# Patient Record
Sex: Female | Born: 1940 | Race: White | Hispanic: No | Marital: Married | Smoking: Never smoker
Health system: Southern US, Community
[De-identification: ages and names within clinical notes are randomized; demographics above are authoritative.]

## PROBLEM LIST (undated history)

## (undated) DIAGNOSIS — F32A Depression, unspecified: Secondary | ICD-10-CM

## (undated) DIAGNOSIS — M4800 Spinal stenosis, site unspecified: Secondary | ICD-10-CM

## (undated) DIAGNOSIS — N390 Urinary tract infection, site not specified: Secondary | ICD-10-CM

## (undated) DIAGNOSIS — J189 Pneumonia, unspecified organism: Secondary | ICD-10-CM

## (undated) DIAGNOSIS — R112 Nausea with vomiting, unspecified: Secondary | ICD-10-CM

## (undated) DIAGNOSIS — R011 Cardiac murmur, unspecified: Secondary | ICD-10-CM

## (undated) DIAGNOSIS — J302 Other seasonal allergic rhinitis: Secondary | ICD-10-CM

## (undated) DIAGNOSIS — K5790 Diverticulosis of intestine, part unspecified, without perforation or abscess without bleeding: Secondary | ICD-10-CM

## (undated) DIAGNOSIS — K219 Gastro-esophageal reflux disease without esophagitis: Secondary | ICD-10-CM

## (undated) DIAGNOSIS — T4145XA Adverse effect of unspecified anesthetic, initial encounter: Secondary | ICD-10-CM

## (undated) DIAGNOSIS — E785 Hyperlipidemia, unspecified: Secondary | ICD-10-CM

## (undated) DIAGNOSIS — T8859XA Other complications of anesthesia, initial encounter: Secondary | ICD-10-CM

## (undated) DIAGNOSIS — C801 Malignant (primary) neoplasm, unspecified: Secondary | ICD-10-CM

## (undated) DIAGNOSIS — J40 Bronchitis, not specified as acute or chronic: Secondary | ICD-10-CM

## (undated) DIAGNOSIS — R059 Cough, unspecified: Secondary | ICD-10-CM

## (undated) DIAGNOSIS — F329 Major depressive disorder, single episode, unspecified: Secondary | ICD-10-CM

## (undated) DIAGNOSIS — G4730 Sleep apnea, unspecified: Secondary | ICD-10-CM

## (undated) DIAGNOSIS — R05 Cough: Secondary | ICD-10-CM

## (undated) DIAGNOSIS — Z9889 Other specified postprocedural states: Secondary | ICD-10-CM

## (undated) DIAGNOSIS — N329 Bladder disorder, unspecified: Secondary | ICD-10-CM

## (undated) DIAGNOSIS — I10 Essential (primary) hypertension: Secondary | ICD-10-CM

## (undated) DIAGNOSIS — M1990 Unspecified osteoarthritis, unspecified site: Secondary | ICD-10-CM

## (undated) HISTORY — PX: SUPERFICIAL LYMPH NODE BIOPSY / EXCISION: SUR127

## (undated) HISTORY — PX: BREAST SURGERY: SHX581

## (undated) HISTORY — PX: LIPOMA EXCISION: SHX5283

## (undated) HISTORY — PX: GANGLION CYST EXCISION: SHX1691

## (undated) HISTORY — PX: EYE SURGERY: SHX253

## (undated) HISTORY — PX: CARDIOVASCULAR STRESS TEST: SHX262

## (undated) HISTORY — PX: CARPAL TUNNEL RELEASE: SHX101

---

## 2011-01-19 ENCOUNTER — Ambulatory Visit (HOSPITAL_COMMUNITY)
Admission: RE | Admit: 2011-01-19 | Discharge: 2011-01-19 | Disposition: A | Discharge status: Home / Self Care | Payer: Medicare Other | Source: Ambulatory Visit | Attending: Neurosurgery | Admitting: Neurosurgery

## 2011-01-19 ENCOUNTER — Other Ambulatory Visit (HOSPITAL_COMMUNITY): Payer: Self-pay | Admitting: Neurosurgery

## 2011-01-19 ENCOUNTER — Encounter (HOSPITAL_COMMUNITY)
Admission: RE | Admit: 2011-01-19 | Discharge: 2011-01-19 | Disposition: A | Discharge status: Home / Self Care | Payer: Medicare Other | Source: Ambulatory Visit | Attending: Neurosurgery | Admitting: Neurosurgery

## 2011-01-19 DIAGNOSIS — Z01818 Encounter for other preprocedural examination: Secondary | ICD-10-CM | POA: Insufficient documentation

## 2011-01-19 DIAGNOSIS — M549 Dorsalgia, unspecified: Secondary | ICD-10-CM

## 2011-01-19 DIAGNOSIS — Z01812 Encounter for preprocedural laboratory examination: Secondary | ICD-10-CM | POA: Insufficient documentation

## 2011-01-20 ENCOUNTER — Encounter (HOSPITAL_COMMUNITY)
Admission: RE | Admit: 2011-01-20 | Discharge: 2011-01-20 | Disposition: A | Discharge status: Home / Self Care | Payer: Medicare Other | Source: Ambulatory Visit | Attending: Neurological Surgery | Admitting: Neurological Surgery

## 2011-01-20 DIAGNOSIS — Z01812 Encounter for preprocedural laboratory examination: Secondary | ICD-10-CM | POA: Insufficient documentation

## 2011-01-21 LAB — CBC
HCT: 40.4 % (ref 36.0–46.0)
Hemoglobin: 12.9 g/dL (ref 12.0–15.0)
MCH: 30.4 pg (ref 26.0–34.0)
MCHC: 31.9 g/dL (ref 30.0–36.0)
RDW: 13.2 % (ref 11.5–15.5)

## 2011-01-21 LAB — BASIC METABOLIC PANEL
CO2: 30 mEq/L (ref 19–32)
Calcium: 9.4 mg/dL (ref 8.4–10.5)
Creatinine, Ser: 0.67 mg/dL (ref 0.4–1.2)
GFR calc non Af Amer: >60 mL/min (ref >60)
Glucose, Bld: 50 mg/dL — ABNORMAL LOW (ref 70–99)
Sodium: 144 mEq/L (ref 135–145)

## 2011-02-25 ENCOUNTER — Encounter (HOSPITAL_COMMUNITY)
Admission: RE | Admit: 2011-02-25 | Discharge: 2011-02-25 | Disposition: A | Discharge status: Home / Self Care | Payer: Medicare Other | Source: Ambulatory Visit | Attending: Neurosurgery | Admitting: Neurosurgery

## 2011-02-25 LAB — DIFFERENTIAL
Basophils Relative: 0 % (ref 0–1)
Eosinophils Absolute: 0.1 10*3/uL (ref 0.0–0.7)
Eosinophils Relative: 3 % (ref 0–5)
Lymphs Abs: 1.6 10*3/uL (ref 0.7–4.0)
Monocytes Absolute: 0.5 10*3/uL (ref 0.1–1.0)
Monocytes Relative: 10 % (ref 3–12)
Neutrophils Relative %: 53 % (ref 43–77)

## 2011-02-25 LAB — BASIC METABOLIC PANEL
BUN: 11 mg/dL (ref 6–23)
Calcium: 9.4 mg/dL (ref 8.4–10.5)
Chloride: 113 mEq/L — ABNORMAL HIGH (ref 96–112)
Creatinine, Ser: 0.76 mg/dL (ref 0.4–1.2)

## 2011-02-25 LAB — CBC
HCT: 37.9 % (ref 36.0–46.0)
MCHC: 33.5 g/dL (ref 30.0–36.0)
Platelets: 166 10*3/uL (ref 150–400)
RDW: 13.2 % (ref 11.5–15.5)

## 2011-02-25 LAB — SURGICAL PCR SCREEN
MRSA, PCR: NEGATIVE
Staphylococcus aureus: NEGATIVE

## 2011-03-04 ENCOUNTER — Observation Stay (HOSPITAL_COMMUNITY): Payer: Medicare Other | Attending: Neurosurgery

## 2011-03-04 ENCOUNTER — Observation Stay (HOSPITAL_COMMUNITY): Payer: Medicare Other

## 2011-03-04 ENCOUNTER — Inpatient Hospital Stay (HOSPITAL_COMMUNITY)
Admission: RE | Admit: 2011-03-04 | Discharge: 2011-03-05 | DRG: 473 | Disposition: A | Discharge status: Home / Self Care | Payer: Medicare Other | Source: Ambulatory Visit | Attending: Neurosurgery | Admitting: Neurosurgery

## 2011-03-04 DIAGNOSIS — M4802 Spinal stenosis, cervical region: Secondary | ICD-10-CM | POA: Diagnosis present

## 2011-03-04 DIAGNOSIS — M47812 Spondylosis without myelopathy or radiculopathy, cervical region: Principal | ICD-10-CM | POA: Diagnosis present

## 2011-03-04 DIAGNOSIS — Z01818 Encounter for other preprocedural examination: Secondary | ICD-10-CM | POA: Insufficient documentation

## 2011-03-18 NOTE — Op Note (Signed)
Terri [name], Terri [name] NO.:  [id number]  MEDICAL RECORD NO.:  [id number]           PATIENT TYPE:  O  LOCATION:  3528                         FACILITY:  [facility]  PHYSICIAN:  Donalee Citrin, M.D.        DATE OF BIRTH:  1941/11/26  DATE OF PROCEDURE:  03/04/2011 DATE OF DISCHARGE:                              OPERATIVE REPORT   PREOPERATIVE DIAGNOSIS:  Cervical spondylosis with stenosis and radiculopathy at C5-6 and C6-7.  PROCEDURE:  Anterior cervical diskectomies and fusion at C5-6 and C6-7 using Globus PEEK cages packed with locally harvested autograft mixed with Actifuse 7 mm at C5-6 and 8 mm at C6-7 and Atlantis translational plate with five 30 mm variable angle screws and one 11-mm rescue screw.  SURGEON:  Donalee Citrin, M.D.  Threasa HeadsYetta Barre.  ANESTHESIA:  General endotracheal.  HISTORY OF PRESENT ILLNESS:  The patient is a very pleasant 70 year old female who has had progressive worsening neck and predominantly left shoulder and arm pain that has been refractory to all forms of conservative treatment.  MRI scan showed cervical spondylosis at multilevel, but the worst of which was C5-6 and C6-7 causing severe spinal cord compression and biforaminal stenosis of both the C6 and C7 nerve roots bilaterally.  Due to the patient's failure to conservative treatment, MRI findings, and clinical exam, the patient was recommended anterior cervical diskectomies and fusion.  I went over the risks and benefits of the operation with the patient.  She understood and agreed to proceed forward.  The patient was brought into the OR, induced under general anesthesia, positioned supine and the neck flexed in slight extension with 5 pounds of halter traction.  The right side of the neck was prepped and draped in usual sterile fashion.  Preop x-ray localized the appropriate level, so a curvilinear incision was made just off midline to the anterior portion of the  sternocleidomastoid.  Superficial layer of platysma was dissected out and divided longitudinally.  The avascular plane between the sternocleidomastoid and strap muscle was developed down to the prevertebral fascia.  Prevertebral fascia was then dissected with Kittner.  Intraoperative x-ray identified the appropriate level of C5-6, so annulotomy was made and disk space was incised.  Large anterior osteophytes were bitten off with 2- and 3-mm Kerrison punch as well as a Gaffer.  Then, both endplates were scraped with a BA curette and then using a high-speed drill capturing the bone shavings in a mucus trap, both disk spaces were drilled down the posterior annulus- osteophyte complex.  At this point, the operating microscope was draped, brought into the field, and under microscopic illumination, first working at C6-7 the disk space was drilled down to the posterior annulus- osteophyte complex and 1-mm Kerrison punch was used to underbite both endplates.  There was marked osteophytosis causing severe thecal sac compression at this level.  The ligament was identified.  Ligament was partially calcified, but it was teased off the dura with a nerve hook and removed piecemeal fashion with 1 and 2-mm Kerrison punch. Aggressively under biting both endplates marching across laterally, both C7 pedicles were identified and both C7  nerve roots were skeletonized, flushed with pedicle.  At the end of the diskectomy, there was no further stenosis either centrally or foraminally, and this was packed with Gelfoam.  Attention was taken to the C5-6.  In the similar fashion, C5-C6 was drilled down.  Again, the predominance of pathology at C5-6 was really at the shoulder of the C6 nerve root.  There was a lot of soft tissue and ligamentous hypertrophy and degenerated facet complex of the uncinate.  This was all aggressively under bitten.  The C6 pedicle was identified.  The C6 nerve root was  decompressed, flushed with pedicle with extensive amount of removal of soft tissue above the shoulder of the C6 nerve root on the left.  The right C6 nerve root was also decompressed and aggressively under bitten,  both endplates decompressed the central canal.  Then, a 7-mm PEEK cage was packed at C5- 6 and an 8 mm at C6-7.  The wound was then copiously irrigated. Meticulous hemostasis was maintained.  An Atlantis translational plate was placed.  All screws had excellent purchase.  Locking mechanisms were engaged.  However, there was one screw at C5 that stripped.  This was removed and replaced with the 11 rescue and this had excellent purchase. Locking mechanism was subsequently engaged.  Then, after meticulous hemostasis was maintained, the wound was copiously irrigated.  Platysma was reapproximated with interrupted Vicryl and the skin was closed with running 4-0 subcuticular.  Benzoin and Steri-Strips were applied.  The patient went to recovery room in stable condition.  At the end of the case needle, sponge, and instrument counts were correct.          ______________________________ Donalee Citrin, M.D.     GC/MEDQ  D:  03/04/2011  T:  03/05/2011  Job:  [id number]  Electronically Signed by Donalee Citrin M.D. on 03/18/2011 05:22:48 PM

## 2011-04-02 NOTE — Discharge Summary (Signed)
  [name], [name] NO.:  [id number]  MEDICAL RECORD NO.:  [id number]           PATIENT TYPE:  I  LOCATION:  3528                         FACILITY:  [facility]  PHYSICIAN:  Donalee Citrin, M.D.        DATE OF BIRTH:  Aug 25, 1941  DATE OF ADMISSION:  03/04/2011 DATE OF DISCHARGE:  03/05/2011                              DISCHARGE SUMMARY   ADMITTING DIAGNOSES:  Cervical spondylosis with radiculopathy.  DISCHARGE DIAGNOSES:  Cervical spondylosis with radiculopathy.  PROCEDURE:  Intracervical diskectomy and fusion at C5-6 and C6-7.  HOSPITAL COURSE:  The patient was admitted in the evening, went to operating room, underwent the aforementioned procedure.  Postop, the patient did very well, went to recovery room floor.  On the floor, the patient convalesced well, was ambulating and voiding spontaneously, tolerating regular diet.  He will be discharged home on day 1, p.o. pain medication, , muscle relaxants and scheduled to follow up in 2 weeks.          ______________________________ Donalee Citrin, M.D.     GC/MEDQ  D:  03/18/2011  T:  03/19/2011  Job:  [id number]  Electronically Signed by Donalee Citrin M.D. on 04/02/2011 11:19:14 AM

## 2011-04-09 ENCOUNTER — Ambulatory Visit
Admission: RE | Admit: 2011-04-09 | Discharge: 2011-04-09 | Disposition: A | Discharge status: Home / Self Care | Payer: Medicare Other | Source: Ambulatory Visit | Attending: Neurosurgery | Admitting: Neurosurgery

## 2011-04-09 ENCOUNTER — Other Ambulatory Visit: Payer: Self-pay | Admitting: Neurosurgery

## 2011-04-09 DIAGNOSIS — M542 Cervicalgia: Secondary | ICD-10-CM

## 2011-05-12 ENCOUNTER — Ambulatory Visit
Admission: RE | Admit: 2011-05-12 | Discharge: 2011-05-12 | Disposition: A | Discharge status: Home / Self Care | Payer: Medicare Other | Source: Ambulatory Visit | Attending: Neurosurgery | Admitting: Neurosurgery

## 2011-05-12 ENCOUNTER — Other Ambulatory Visit: Payer: Self-pay | Admitting: Neurosurgery

## 2011-05-12 DIAGNOSIS — M542 Cervicalgia: Secondary | ICD-10-CM

## 2011-08-11 ENCOUNTER — Other Ambulatory Visit: Payer: Self-pay | Admitting: Neurosurgery

## 2011-08-11 ENCOUNTER — Ambulatory Visit
Admission: RE | Admit: 2011-08-11 | Discharge: 2011-08-11 | Disposition: A | Discharge status: Home / Self Care | Payer: Medicare Other | Source: Ambulatory Visit | Attending: Neurosurgery | Admitting: Neurosurgery

## 2011-08-11 DIAGNOSIS — M542 Cervicalgia: Secondary | ICD-10-CM

## 2012-04-07 IMAGING — CR DG CHEST 2V
2 series · 2 of 2 positions shown · non-contrast
Comparison: None.

CLINICAL DATA: Preoperative film in patient for lumbar
microdiskectomy.

CHEST - 2 VIEW

[view not recorded (1 of 2)]
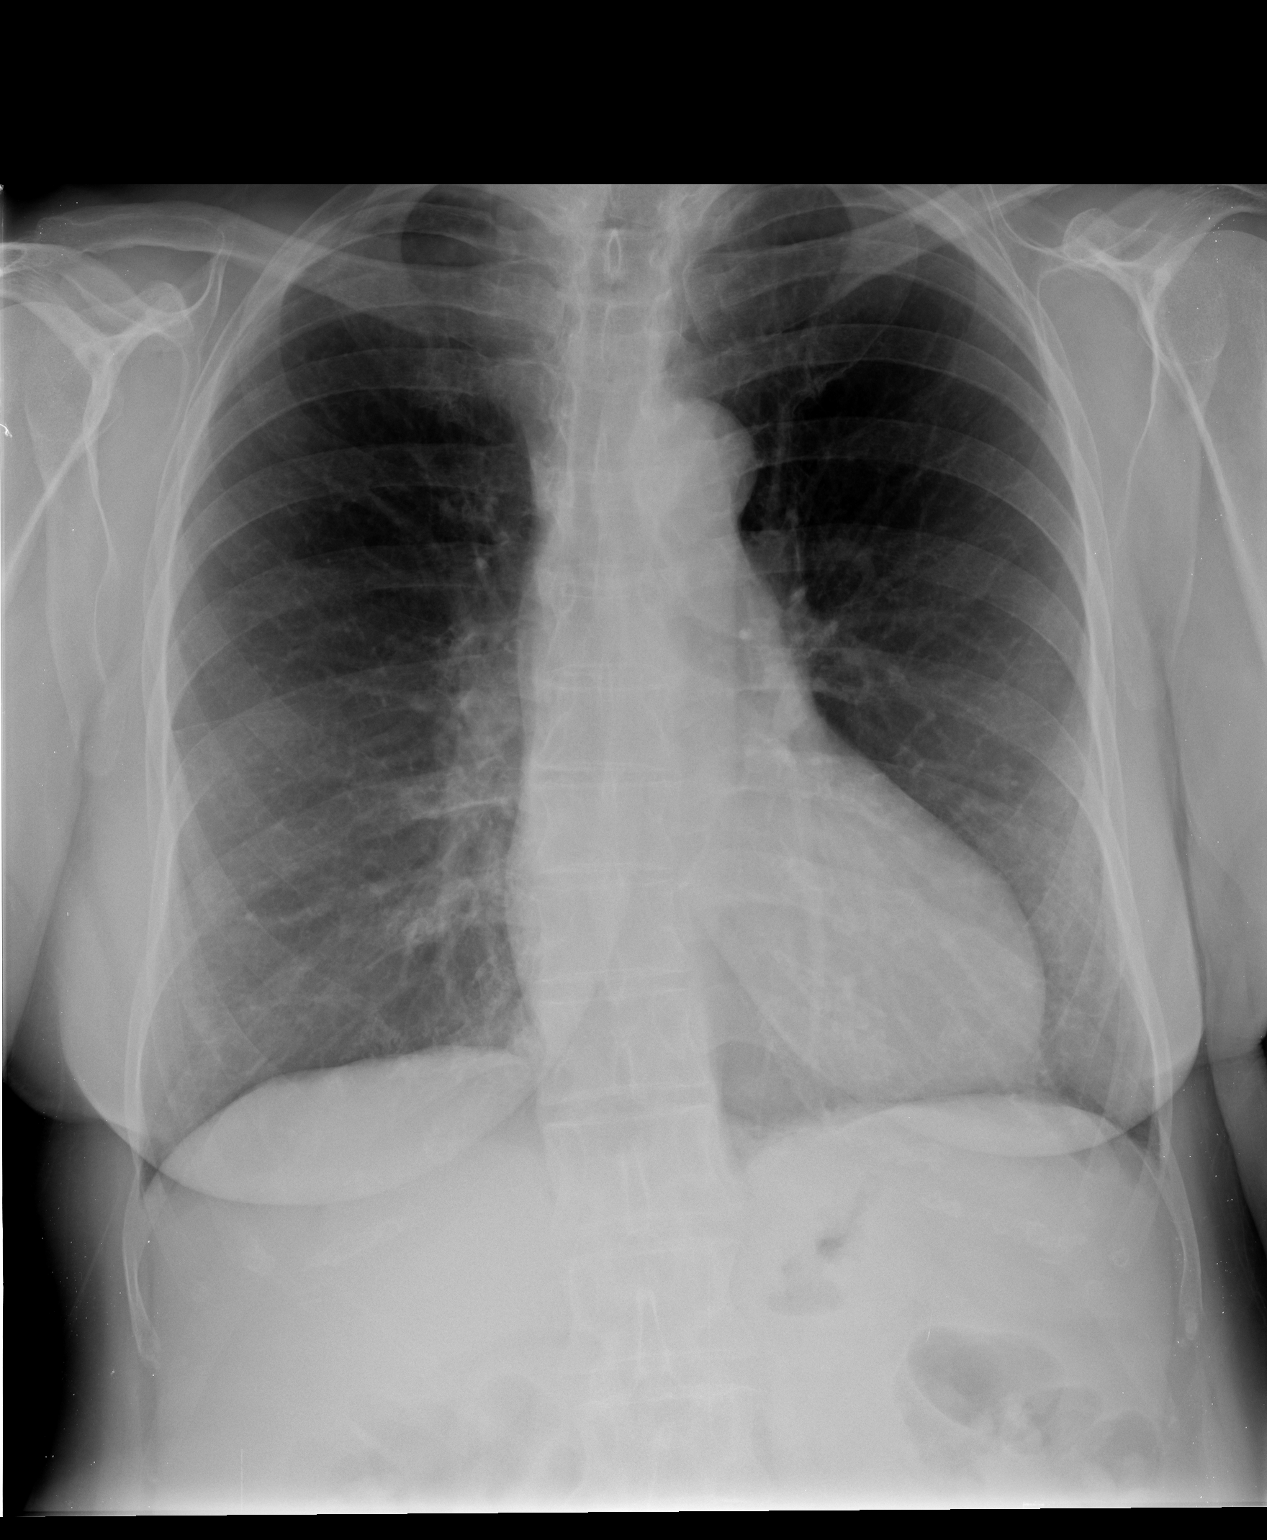

[view not recorded (2 of 2)]
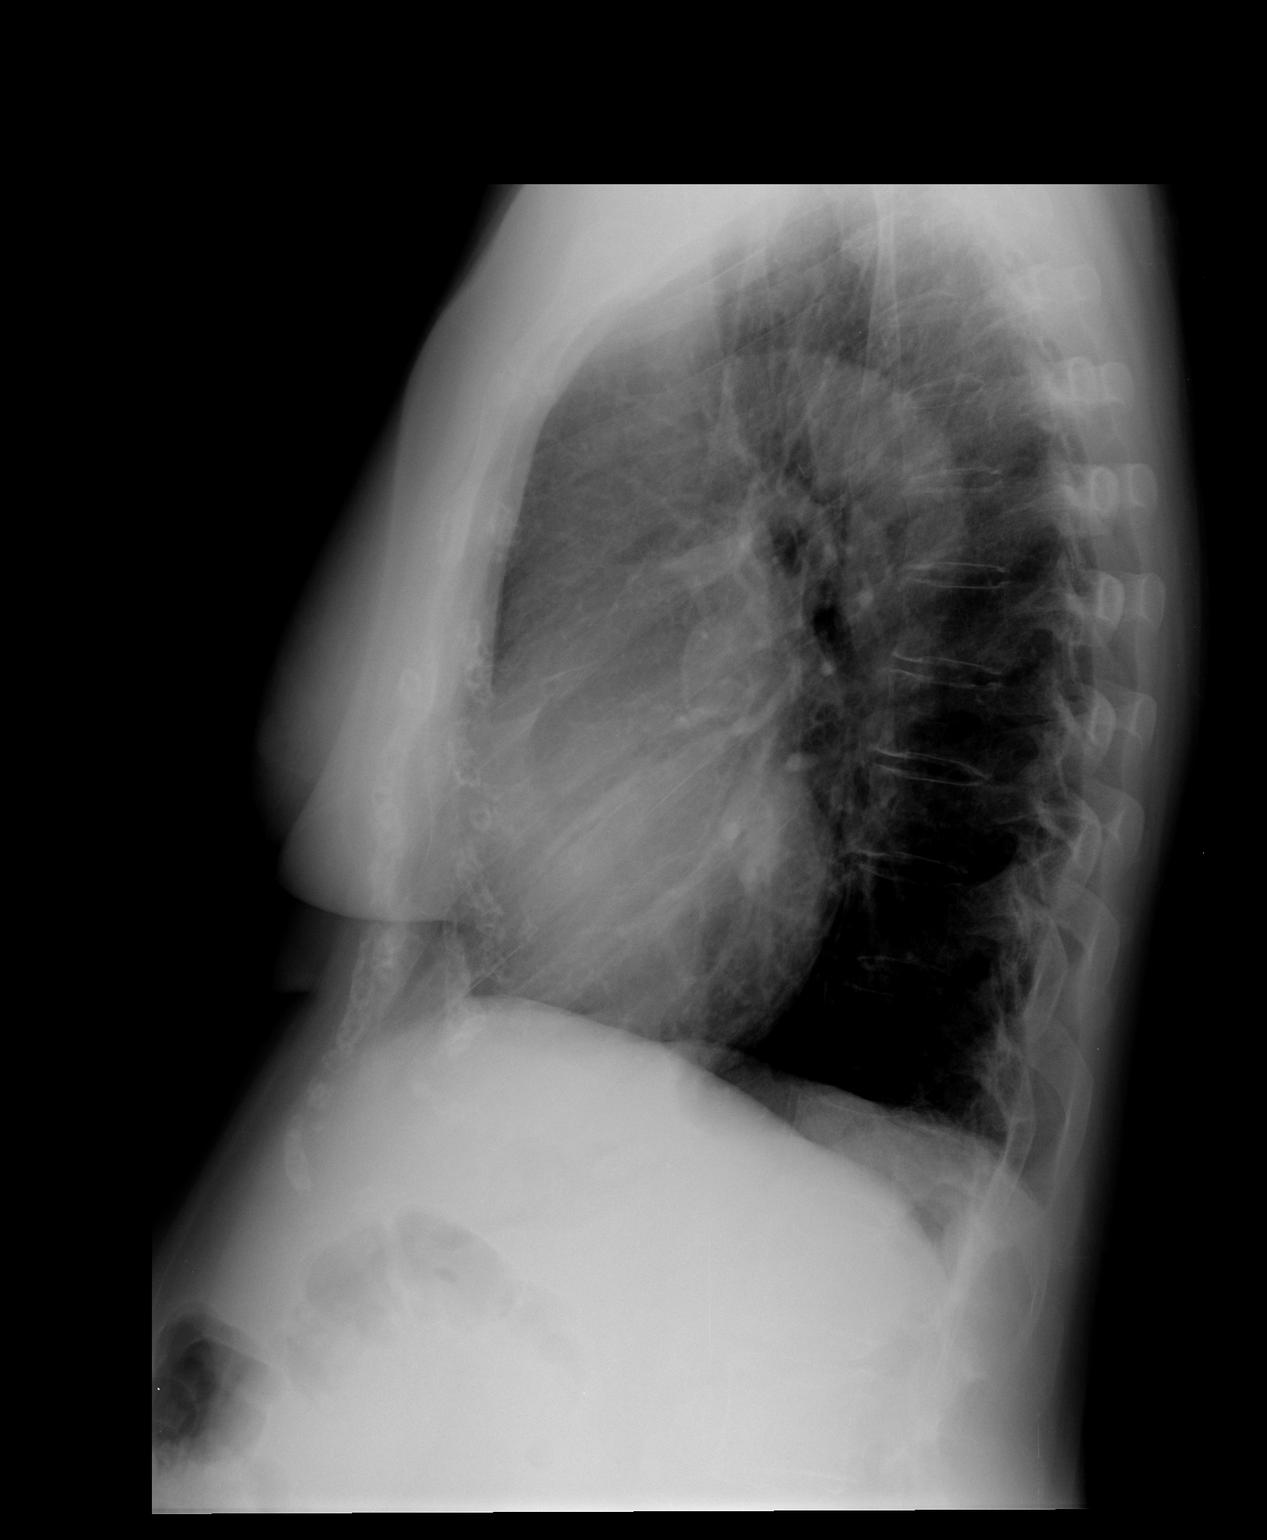

[2 of 2 positions shown; findings below may reference images not displayed]

FINDINGS: Lungs are clear.  No pneumothorax or pleural effusion.
Heart size normal.
IMPRESSION: No acute disease.

## 2012-12-29 ENCOUNTER — Other Ambulatory Visit: Payer: Self-pay | Admitting: Neurosurgery

## 2013-01-03 ENCOUNTER — Encounter (HOSPITAL_COMMUNITY): Payer: Self-pay | Admitting: Pharmacy Technician

## 2013-01-09 ENCOUNTER — Encounter (HOSPITAL_COMMUNITY): Payer: Self-pay

## 2013-01-09 ENCOUNTER — Encounter (HOSPITAL_COMMUNITY)
Admission: RE | Admit: 2013-01-09 | Discharge: 2013-01-09 | Disposition: A | Discharge status: Home / Self Care | Payer: Medicare Other | Source: Ambulatory Visit | Attending: Anesthesiology | Admitting: Anesthesiology

## 2013-01-09 ENCOUNTER — Encounter (HOSPITAL_COMMUNITY)
Admission: RE | Admit: 2013-01-09 | Discharge: 2013-01-09 | Disposition: A | Discharge status: Home / Self Care | Payer: Medicare Other | Source: Ambulatory Visit | Attending: Neurosurgery | Admitting: Neurosurgery

## 2013-01-09 HISTORY — DX: Cough: R05

## 2013-01-09 HISTORY — DX: Diverticulosis of intestine, part unspecified, without perforation or abscess without bleeding: K57.90

## 2013-01-09 HISTORY — DX: Major depressive disorder, single episode, unspecified: F32.9

## 2013-01-09 HISTORY — DX: Nausea with vomiting, unspecified: R11.2

## 2013-01-09 HISTORY — DX: Bladder disorder, unspecified: N32.9

## 2013-01-09 HISTORY — DX: Malignant (primary) neoplasm, unspecified: C80.1

## 2013-01-09 HISTORY — DX: Depression, unspecified: F32.A

## 2013-01-09 HISTORY — DX: Cardiac murmur, unspecified: R01.1

## 2013-01-09 HISTORY — DX: Sleep apnea, unspecified: G47.30

## 2013-01-09 HISTORY — DX: Unspecified osteoarthritis, unspecified site: M19.90

## 2013-01-09 HISTORY — DX: Other seasonal allergic rhinitis: J30.2

## 2013-01-09 HISTORY — DX: Gastro-esophageal reflux disease without esophagitis: K21.9

## 2013-01-09 HISTORY — DX: Other specified postprocedural states: Z98.890

## 2013-01-09 HISTORY — DX: Hyperlipidemia, unspecified: E78.5

## 2013-01-09 HISTORY — DX: Adverse effect of unspecified anesthetic, initial encounter: T41.45XA

## 2013-01-09 HISTORY — DX: Other complications of anesthesia, initial encounter: T88.59XA

## 2013-01-09 HISTORY — DX: Essential (primary) hypertension: I10

## 2013-01-09 HISTORY — DX: Bronchitis, not specified as acute or chronic: J40

## 2013-01-09 HISTORY — DX: Cough, unspecified: R05.9

## 2013-01-09 HISTORY — DX: Pneumonia, unspecified organism: J18.9

## 2013-01-09 HISTORY — DX: Spinal stenosis, site unspecified: M48.00

## 2013-01-09 LAB — CBC
HCT: 37 % (ref 36.0–46.0)
Platelets: 153 10*3/uL (ref 150–400)
RDW: 12.4 % (ref 11.5–15.5)
WBC: 4.6 10*3/uL (ref 4.0–10.5)

## 2013-01-09 LAB — BASIC METABOLIC PANEL
Calcium: 9.1 mg/dL (ref 8.4–10.5)
GFR calc non Af Amer: 85 mL/min — ABNORMAL LOW (ref >90)
Glucose, Bld: 93 mg/dL (ref 70–99)
Sodium: 139 mEq/L (ref 135–145)

## 2013-01-09 LAB — SURGICAL PCR SCREEN: MRSA, PCR: NEGATIVE

## 2013-01-09 NOTE — Pre-Procedure Instructions (Signed)
JAREE DWIGHT  01/09/2013   Your procedure is scheduled on:  Monday January 16, 2013  Report to [facility] at *5:30AM.  Call this number if you have problems the morning of surgery: [phone]   Remember:   Do not eat food or drink liquids after midnight.   Take these medicines the morning of surgery with A SIP OF WATER: celexa, nasonex,     Do not wear jewelry, make-up or nail polish.  Do not wear lotions, powders, or perfumes  Do not shave 48 hours prior to surgery.   Do not bring valuables to the hospital.  Contacts, dentures or bridgework may not be worn into surgery.  Leave suitcase in the car. After surgery it may be brought to your room.    Patients discharged the day of surgery will not be allowed to drive home.  Name and phone number of your driver: family / friend  Special Instructions: Shower using CHG 2 nights before surgery and the night before surgery.  If you shower the day of surgery use CHG.  Use special wash - you have one bottle of CHG for all showers.  You should use approximately 1/3 of the bottle for each shower.   Please read over the following fact sheets that you were given: Pain Booklet, Coughing and Deep Breathing, Blood Transfusion Information, MRSA Information and Surgical Site Infection Prevention

## 2013-01-10 NOTE — Consult Note (Signed)
Anesthesia chart review: Patient is a 72 year old female posted for three-level PLIF by Dr. Wynetta Emery on 01/17/2012. History includes non-smoker, HTN, HLD, depression, GERD, OSA, bladder stem dilation, skin cancer. She also reports a history of a "mild" murmur (not otherwise specified); however, there is no mention of murmur in her latest cardiology notes.  There is mention of bradycardia and palpitations though.  For anesthesia history she reported post-operative N/V and coughing/dysphagia/chest pain after a previous surgery that resolved with medication from her PCP.  She was seen by Cardiologist Dr. Tereso Newcomer at [facility] on 12/22/12 for a pre-operative evaluation.  EKG then showed SB @ 55 bpm, nonspecific ST/T wave abnormality.  He felt she was low risk for surgery and no further cardiac evaluation was needed prior to surgery.  His note mentions a history of a normal stress test in 2011.     Preoperative CXR and labs noted.  Anticipate she can proceed as planned.  Shonna Chock, PA-C 01/10/13 1429

## 2013-01-13 DIAGNOSIS — N390 Urinary tract infection, site not specified: Secondary | ICD-10-CM

## 2013-01-13 HISTORY — DX: Urinary tract infection, site not specified: N39.0

## 2013-01-16 ENCOUNTER — Encounter (HOSPITAL_COMMUNITY)
Admission: RE | Disposition: A | Discharge status: Skilled Nursing Facility | Payer: Self-pay | Source: Ambulatory Visit | Attending: Neurosurgery

## 2013-01-16 ENCOUNTER — Inpatient Hospital Stay (HOSPITAL_COMMUNITY): Payer: Medicare Other | Admitting: Vascular Surgery

## 2013-01-16 ENCOUNTER — Inpatient Hospital Stay (HOSPITAL_COMMUNITY)
Admission: RE | Admit: 2013-01-16 | Discharge: 2013-01-24 | DRG: 460 | Disposition: A | Discharge status: Skilled Nursing Facility | Payer: Medicare Other | Source: Ambulatory Visit | Attending: Neurosurgery | Admitting: Neurosurgery

## 2013-01-16 ENCOUNTER — Inpatient Hospital Stay (HOSPITAL_COMMUNITY): Payer: Medicare Other

## 2013-01-16 ENCOUNTER — Encounter (HOSPITAL_COMMUNITY): Payer: Self-pay | Admitting: Vascular Surgery

## 2013-01-16 ENCOUNTER — Encounter (HOSPITAL_COMMUNITY): Payer: Self-pay | Admitting: *Deleted

## 2013-01-16 DIAGNOSIS — G4730 Sleep apnea, unspecified: Secondary | ICD-10-CM | POA: Diagnosis present

## 2013-01-16 DIAGNOSIS — X58XXXA Exposure to other specified factors, initial encounter: Secondary | ICD-10-CM | POA: Diagnosis present

## 2013-01-16 DIAGNOSIS — M4120 Other idiopathic scoliosis, site unspecified: Secondary | ICD-10-CM | POA: Diagnosis present

## 2013-01-16 DIAGNOSIS — Z860100 Personal history of colon polyps, unspecified: Secondary | ICD-10-CM

## 2013-01-16 DIAGNOSIS — M5137 Other intervertebral disc degeneration, lumbosacral region: Principal | ICD-10-CM | POA: Diagnosis present

## 2013-01-16 DIAGNOSIS — I10 Essential (primary) hypertension: Secondary | ICD-10-CM | POA: Diagnosis present

## 2013-01-16 DIAGNOSIS — IMO0002 Reserved for concepts with insufficient information to code with codable children: Secondary | ICD-10-CM | POA: Diagnosis present

## 2013-01-16 DIAGNOSIS — Z79899 Other long term (current) drug therapy: Secondary | ICD-10-CM

## 2013-01-16 DIAGNOSIS — S058X9A Other injuries of unspecified eye and orbit, initial encounter: Secondary | ICD-10-CM | POA: Diagnosis not present

## 2013-01-16 DIAGNOSIS — Z881 Allergy status to other antibiotic agents status: Secondary | ICD-10-CM

## 2013-01-16 DIAGNOSIS — R112 Nausea with vomiting, unspecified: Secondary | ICD-10-CM | POA: Diagnosis not present

## 2013-01-16 DIAGNOSIS — Z888 Allergy status to other drugs, medicaments and biological substances status: Secondary | ICD-10-CM

## 2013-01-16 DIAGNOSIS — E785 Hyperlipidemia, unspecified: Secondary | ICD-10-CM | POA: Diagnosis present

## 2013-01-16 DIAGNOSIS — G9741 Accidental puncture or laceration of dura during a procedure: Secondary | ICD-10-CM | POA: Diagnosis not present

## 2013-01-16 DIAGNOSIS — M51379 Other intervertebral disc degeneration, lumbosacral region without mention of lumbar back pain or lower extremity pain: Principal | ICD-10-CM | POA: Diagnosis present

## 2013-01-16 DIAGNOSIS — K5730 Diverticulosis of large intestine without perforation or abscess without bleeding: Secondary | ICD-10-CM | POA: Diagnosis present

## 2013-01-16 DIAGNOSIS — F329 Major depressive disorder, single episode, unspecified: Secondary | ICD-10-CM | POA: Diagnosis present

## 2013-01-16 DIAGNOSIS — Q762 Congenital spondylolisthesis: Secondary | ICD-10-CM

## 2013-01-16 DIAGNOSIS — F3289 Other specified depressive episodes: Secondary | ICD-10-CM | POA: Diagnosis present

## 2013-01-16 DIAGNOSIS — K219 Gastro-esophageal reflux disease without esophagitis: Secondary | ICD-10-CM | POA: Diagnosis present

## 2013-01-16 DIAGNOSIS — Z01812 Encounter for preprocedural laboratory examination: Secondary | ICD-10-CM

## 2013-01-16 DIAGNOSIS — N390 Urinary tract infection, site not specified: Secondary | ICD-10-CM | POA: Diagnosis not present

## 2013-01-16 DIAGNOSIS — Y9210 Unspecified residential institution as the place of occurrence of the external cause: Secondary | ICD-10-CM | POA: Diagnosis present

## 2013-01-16 HISTORY — DX: Urinary tract infection, site not specified: N39.0

## 2013-01-16 LAB — POCT I-STAT 4, (NA,K, GLUC, HGB,HCT)
Glucose, Bld: 119 mg/dL — ABNORMAL HIGH (ref 70–99)
Glucose, Bld: 125 mg/dL — ABNORMAL HIGH (ref 70–99)
HCT: 28 % — ABNORMAL LOW (ref 36.0–46.0)
Hemoglobin: 8.5 g/dL — ABNORMAL LOW (ref 12.0–15.0)
Sodium: 140 mEq/L (ref 135–145)

## 2013-01-16 LAB — CBC
HCT: 38.7 % (ref 36.0–46.0)
MCV: 92.8 fL (ref 78.0–100.0)
Platelets: 86 10*3/uL — ABNORMAL LOW (ref 150–400)
RBC: 4.17 MIL/uL (ref 3.87–5.11)
RDW: 13.2 % (ref 11.5–15.5)
WBC: 9.6 10*3/uL (ref 4.0–10.5)

## 2013-01-16 LAB — BASIC METABOLIC PANEL
BUN: 13 mg/dL (ref 6–23)
CO2: 25 mEq/L (ref 19–32)
Calcium: 7.6 mg/dL — ABNORMAL LOW (ref 8.4–10.5)
Creatinine, Ser: 0.64 mg/dL (ref 0.50–1.10)
Glucose, Bld: 136 mg/dL — ABNORMAL HIGH (ref 70–99)

## 2013-01-16 LAB — PREPARE RBC (CROSSMATCH)

## 2013-01-16 SURGERY — POSTERIOR LUMBAR FUSION 2 LEVEL
Anesthesia: General | Site: Spine Lumbar | Laterality: Bilateral | Wound class: Clean

## 2013-01-16 MED ORDER — AMOXICILLIN 500 MG PO CAPS
1000.0000 mg | ORAL_CAPSULE | Freq: Two times a day (BID) | ORAL | Status: AC
  Administered 2013-01-16 – 2013-01-19 (×7): 1000 mg via ORAL
  Filled 2013-01-16 (×7): qty 2

## 2013-01-16 MED ORDER — SODIUM CHLORIDE 0.9 % IV SOLN
INTRAVENOUS | Status: DC | PRN
  Administered 2013-01-16 (×2): via INTRAVENOUS

## 2013-01-16 MED ORDER — SIMVASTATIN 40 MG PO TABS
40.0000 mg | ORAL_TABLET | Freq: Every evening | ORAL | Status: DC
  Administered 2013-01-16 – 2013-01-23 (×8): 40 mg via ORAL
  Filled 2013-01-16 (×9): qty 1

## 2013-01-16 MED ORDER — OXYCODONE-ACETAMINOPHEN 5-325 MG PO TABS
1.0000 | ORAL_TABLET | ORAL | Status: DC | PRN
  Administered 2013-01-19: 1 via ORAL
  Administered 2013-01-19: 2 via ORAL
  Administered 2013-01-19: 1 via ORAL
  Administered 2013-01-20: 2 via ORAL
  Administered 2013-01-20 (×3): 1 via ORAL
  Administered 2013-01-21: 2 via ORAL
  Filled 2013-01-16: qty 2
  Filled 2013-01-16 (×2): qty 1
  Filled 2013-01-16: qty 2
  Filled 2013-01-16 (×3): qty 1
  Filled 2013-01-16: qty 2

## 2013-01-16 MED ORDER — PREDNISOLONE ACETATE 1 % OP SUSP
1.0000 [drp] | Freq: Four times a day (QID) | OPHTHALMIC | Status: DC
  Administered 2013-01-16 – 2013-01-24 (×32): 1 [drp] via OPHTHALMIC
  Filled 2013-01-16 (×2): qty 1

## 2013-01-16 MED ORDER — ARTIFICIAL TEARS OP OINT
TOPICAL_OINTMENT | OPHTHALMIC | Status: DC | PRN
  Administered 2013-01-16: 1 via OPHTHALMIC

## 2013-01-16 MED ORDER — NITROFURANTOIN MONOHYD MACRO 100 MG PO CAPS
100.0000 mg | ORAL_CAPSULE | Freq: Two times a day (BID) | ORAL | Status: AC
  Administered 2013-01-16 – 2013-01-19 (×7): 100 mg via ORAL
  Filled 2013-01-16 (×7): qty 1

## 2013-01-16 MED ORDER — SODIUM CHLORIDE 0.9 % IV SOLN: 250.0000 mL | INTRAVENOUS | Status: DC

## 2013-01-16 MED ORDER — ACETAMINOPHEN 325 MG PO TABS
650.0000 mg | ORAL_TABLET | ORAL | Status: DC | PRN
  Administered 2013-01-18 – 2013-01-23 (×2): 650 mg via ORAL
  Filled 2013-01-16 (×2): qty 2

## 2013-01-16 MED ORDER — ONDANSETRON HCL 4 MG/2ML IJ SOLN
4.0000 mg | INTRAMUSCULAR | Status: DC | PRN
  Administered 2013-01-17 – 2013-01-19 (×4): 4 mg via INTRAVENOUS
  Filled 2013-01-16 (×4): qty 2

## 2013-01-16 MED ORDER — THROMBIN 20000 UNITS EX SOLR
CUTANEOUS | Status: DC | PRN
  Administered 2013-01-16 (×2): via TOPICAL

## 2013-01-16 MED ORDER — BUPIVACAINE HCL (PF) 0.25 % IJ SOLN
INTRAMUSCULAR | Status: DC | PRN
  Administered 2013-01-16: 10 mL

## 2013-01-16 MED ORDER — FLUTICASONE PROPIONATE 50 MCG/ACT NA SUSP
1.0000 | Freq: Every day | NASAL | Status: DC
  Administered 2013-01-17 – 2013-01-24 (×8): 1 via NASAL
  Filled 2013-01-16: qty 16

## 2013-01-16 MED ORDER — ACETAMINOPHEN 650 MG RE SUPP: 650.0000 mg | RECTAL | Status: DC | PRN

## 2013-01-16 MED ORDER — CYCLOBENZAPRINE HCL 10 MG PO TABS
10.0000 mg | ORAL_TABLET | Freq: Three times a day (TID) | ORAL | Status: DC | PRN
  Administered 2013-01-18 – 2013-01-22 (×5): 10 mg via ORAL
  Filled 2013-01-16 (×5): qty 1

## 2013-01-16 MED ORDER — HYDROMORPHONE HCL PF 1 MG/ML IJ SOLN
INTRAMUSCULAR | Status: AC
  Filled 2013-01-16: qty 1

## 2013-01-16 MED ORDER — CITALOPRAM HYDROBROMIDE 20 MG PO TABS
20.0000 mg | ORAL_TABLET | Freq: Every day | ORAL | Status: DC
  Administered 2013-01-17 – 2013-01-24 (×8): 20 mg via ORAL
  Filled 2013-01-16 (×8): qty 1

## 2013-01-16 MED ORDER — ALUM & MAG HYDROXIDE-SIMETH 200-200-20 MG/5ML PO SUSP: 30.0000 mL | Freq: Four times a day (QID) | ORAL | Status: DC | PRN

## 2013-01-16 MED ORDER — AMOXICILLIN 875 MG PO TABS: 875.0000 mg | ORAL_TABLET | Freq: Two times a day (BID) | ORAL | Status: DC

## 2013-01-16 MED ORDER — PROPOFOL 10 MG/ML IV BOLUS
INTRAVENOUS | Status: DC | PRN
  Administered 2013-01-16: 150 mg via INTRAVENOUS

## 2013-01-16 MED ORDER — ALBUMIN HUMAN 5 % IV SOLN
INTRAVENOUS | Status: DC | PRN
  Administered 2013-01-16 (×3): via INTRAVENOUS

## 2013-01-16 MED ORDER — SODIUM CHLORIDE 0.9 % IJ SOLN: 3.0000 mL | INTRAMUSCULAR | Status: DC | PRN

## 2013-01-16 MED ORDER — MENTHOL 3 MG MT LOZG: 1.0000 | LOZENGE | OROMUCOSAL | Status: DC | PRN

## 2013-01-16 MED ORDER — LACTATED RINGERS IV SOLN
INTRAVENOUS | Status: DC | PRN
  Administered 2013-01-16 (×2): via INTRAVENOUS

## 2013-01-16 MED ORDER — FENTANYL CITRATE 0.05 MG/ML IJ SOLN
INTRAMUSCULAR | Status: DC | PRN
  Administered 2013-01-16 (×2): 50 ug via INTRAVENOUS
  Administered 2013-01-16: 100 ug via INTRAVENOUS
  Administered 2013-01-16: 50 ug via INTRAVENOUS

## 2013-01-16 MED ORDER — ACETAMINOPHEN 10 MG/ML IV SOLN
1000.0000 mg | Freq: Once | INTRAVENOUS | Status: AC | PRN
  Administered 2013-01-16: 1000 mg via INTRAVENOUS

## 2013-01-16 MED ORDER — ROCURONIUM BROMIDE 100 MG/10ML IV SOLN
INTRAVENOUS | Status: DC | PRN
  Administered 2013-01-16: 40 mg via INTRAVENOUS
  Administered 2013-01-16 (×2): 10 mg via INTRAVENOUS

## 2013-01-16 MED ORDER — LIDOCAINE-EPINEPHRINE 1 %-1:100000 IJ SOLN
INTRAMUSCULAR | Status: DC | PRN
  Administered 2013-01-16: 10 mL

## 2013-01-16 MED ORDER — KCL IN DEXTROSE-NACL 20-5-0.45 MEQ/L-%-% IV SOLN
INTRAVENOUS | Status: DC
  Administered 2013-01-16 – 2013-01-17 (×4): via INTRAVENOUS
  Filled 2013-01-16 (×5): qty 1000

## 2013-01-16 MED ORDER — ONDANSETRON HCL 4 MG/2ML IJ SOLN
INTRAMUSCULAR | Status: DC | PRN
  Administered 2013-01-16 (×2): 4 mg via INTRAVENOUS

## 2013-01-16 MED ORDER — EPHEDRINE SULFATE 50 MG/ML IJ SOLN
INTRAMUSCULAR | Status: DC | PRN
  Administered 2013-01-16: 5 mg via INTRAVENOUS
  Administered 2013-01-16 (×3): 10 mg via INTRAVENOUS
  Administered 2013-01-16: 15 mg via INTRAVENOUS

## 2013-01-16 MED ORDER — 0.9 % SODIUM CHLORIDE (POUR BTL) OPTIME
TOPICAL | Status: DC | PRN
  Administered 2013-01-16: 1000 mL

## 2013-01-16 MED ORDER — NEOSTIGMINE METHYLSULFATE 1 MG/ML IJ SOLN
INTRAMUSCULAR | Status: DC | PRN
  Administered 2013-01-16: 4 mg via INTRAVENOUS

## 2013-01-16 MED ORDER — LIDOCAINE HCL (CARDIAC) 20 MG/ML IV SOLN
INTRAVENOUS | Status: DC | PRN
  Administered 2013-01-16: 40 mg via INTRAVENOUS

## 2013-01-16 MED ORDER — CEFAZOLIN SODIUM-DEXTROSE 2-3 GM-% IV SOLR
INTRAVENOUS | Status: AC
  Administered 2013-01-16: 2 g via INTRAVENOUS
  Filled 2013-01-16: qty 50

## 2013-01-16 MED ORDER — KCL IN DEXTROSE-NACL 20-5-0.45 MEQ/L-%-% IV SOLN
INTRAVENOUS | Status: AC
  Filled 2013-01-16: qty 1000

## 2013-01-16 MED ORDER — THROMBIN 5000 UNITS EX SOLR
OROMUCOSAL | Status: DC | PRN
  Administered 2013-01-16: 12:00:00 via TOPICAL

## 2013-01-16 MED ORDER — ONDANSETRON HCL 4 MG/2ML IJ SOLN: 4.0000 mg | Freq: Once | INTRAMUSCULAR | Status: DC | PRN

## 2013-01-16 MED ORDER — SODIUM CHLORIDE 0.9 % IV SOLN
INTRAVENOUS | Status: AC
  Filled 2013-01-16: qty 500

## 2013-01-16 MED ORDER — GLYCOPYRROLATE 0.2 MG/ML IJ SOLN
INTRAMUSCULAR | Status: DC | PRN
  Administered 2013-01-16: 0.2 mg via INTRAVENOUS
  Administered 2013-01-16: 0.6 mg via INTRAVENOUS

## 2013-01-16 MED ORDER — SODIUM CHLORIDE 0.9 % IJ SOLN
3.0000 mL | Freq: Two times a day (BID) | INTRAMUSCULAR | Status: DC
  Administered 2013-01-16 – 2013-01-20 (×8): 3 mL via INTRAVENOUS

## 2013-01-16 MED ORDER — HYDROMORPHONE HCL PF 1 MG/ML IJ SOLN
0.2500 mg | INTRAMUSCULAR | Status: DC | PRN
  Administered 2013-01-16 (×3): 0.5 mg via INTRAVENOUS

## 2013-01-16 MED ORDER — LACTATED RINGERS IV SOLN
INTRAVENOUS | Status: DC | PRN
  Administered 2013-01-16 (×3): via INTRAVENOUS

## 2013-01-16 MED ORDER — HYDROMORPHONE HCL PF 1 MG/ML IJ SOLN
0.5000 mg | INTRAMUSCULAR | Status: DC | PRN
  Administered 2013-01-16 (×2): 0.5 mg via INTRAVENOUS
  Administered 2013-01-17 (×3): 1 mg via INTRAVENOUS
  Filled 2013-01-16 (×6): qty 1

## 2013-01-16 MED ORDER — ACETAMINOPHEN 10 MG/ML IV SOLN
INTRAVENOUS | Status: AC
  Filled 2013-01-16: qty 100

## 2013-01-16 MED ORDER — BACITRACIN 50000 UNITS IM SOLR
INTRAMUSCULAR | Status: AC
  Filled 2013-01-16: qty 1

## 2013-01-16 MED ORDER — NITROFURANTOIN MONOHYD MACRO 100 MG PO CAPS
100.0000 mg | ORAL_CAPSULE | Freq: Two times a day (BID) | ORAL | Status: DC
  Filled 2013-01-16: qty 1

## 2013-01-16 MED ORDER — PHENOL 1.4 % MT LIQD: 1.0000 | OROMUCOSAL | Status: DC | PRN

## 2013-01-16 MED ORDER — WHITE PETROLATUM GEL
Status: AC
  Administered 2013-01-16: 17:00:00
  Filled 2013-01-16: qty 5

## 2013-01-16 MED ORDER — SODIUM CHLORIDE 0.9 % IR SOLN
Status: DC | PRN
  Administered 2013-01-16: 09:00:00

## 2013-01-16 SURGICAL SUPPLY — 82 items
ADH SKN CLS APL DERMABOND .7 (GAUZE/BANDAGES/DRESSINGS)
ADH SKN CLS LQ APL DERMABOND (GAUZE/BANDAGES/DRESSINGS) ×1
APL SKNCLS STERI-STRIP NONHPOA (GAUZE/BANDAGES/DRESSINGS) ×1
BAG DECANTER FOR FLEXI CONT (MISCELLANEOUS) ×2 IMPLANT
BENZOIN TINCTURE PRP APPL 2/3 (GAUZE/BANDAGES/DRESSINGS) ×2 IMPLANT
BLADE SURG 11 STRL SS (BLADE) ×2 IMPLANT
BLADE SURG ROTATE 9660 (MISCELLANEOUS) IMPLANT
BRUSH SCRUB EZ PLAIN DRY (MISCELLANEOUS) ×2 IMPLANT
BUR MATCHSTICK NEURO 3.0 LAGG (BURR) ×2 IMPLANT
BUR PRECISION FLUTE 6.0 (BURR) ×2 IMPLANT
CANISTER SUCTION 2500CC (MISCELLANEOUS) ×2 IMPLANT
CAP LOCKING REVERE (Cap) ×6 IMPLANT
CLOTH BEACON ORANGE TIMEOUT ST (SAFETY) ×2 IMPLANT
CONNECTOR VAR CROSS 5.5X38-50 (Connector) ×1 IMPLANT
CONT SPEC 4OZ CLIKSEAL STRL BL (MISCELLANEOUS) ×4 IMPLANT
COVER BACK TABLE 24X17X13 BIG (DRAPES) IMPLANT
COVER TABLE BACK 60X90 (DRAPES) ×2 IMPLANT
DECANTER SPIKE VIAL GLASS SM (MISCELLANEOUS) ×2 IMPLANT
DERMABOND ADHESIVE PROPEN (GAUZE/BANDAGES/DRESSINGS) ×1
DERMABOND ADVANCED (GAUZE/BANDAGES/DRESSINGS)
DERMABOND ADVANCED .7 DNX12 (GAUZE/BANDAGES/DRESSINGS) IMPLANT
DERMABOND ADVANCED .7 DNX6 (GAUZE/BANDAGES/DRESSINGS) IMPLANT
DRAPE C-ARM 42X72 X-RAY (DRAPES) ×4 IMPLANT
DRAPE LAPAROTOMY 100X72X124 (DRAPES) ×2 IMPLANT
DRAPE POUCH INSTRU U-SHP 10X18 (DRAPES) ×2 IMPLANT
DRAPE PROXIMA HALF (DRAPES) ×1 IMPLANT
DRAPE SURG 17X23 STRL (DRAPES) ×2 IMPLANT
DRSG OPSITE 4X5.5 SM (GAUZE/BANDAGES/DRESSINGS) ×4 IMPLANT
DURASEAL SPINE SEALANT 3ML (MISCELLANEOUS) ×2 IMPLANT
ELECT REM PT RETURN 9FT ADLT (ELECTROSURGICAL) ×2
ELECTRODE REM PT RTRN 9FT ADLT (ELECTROSURGICAL) ×1 IMPLANT
EVACUATOR 1/8 PVC DRAIN (DRAIN) ×2 IMPLANT
EVACUATOR 3/16  PVC DRAIN (DRAIN)
EVACUATOR 3/16 PVC DRAIN (DRAIN) IMPLANT
GAUZE SPONGE 4X4 16PLY XRAY LF (GAUZE/BANDAGES/DRESSINGS) ×2 IMPLANT
GLOVE BIO SURGEON STRL SZ8 (GLOVE) ×4 IMPLANT
GLOVE BIOGEL PI IND STRL 8 (GLOVE) IMPLANT
GLOVE BIOGEL PI INDICATOR 8 (GLOVE) ×1
GLOVE ECLIPSE 7.5 STRL STRAW (GLOVE) ×2 IMPLANT
GLOVE EXAM NITRILE LRG STRL (GLOVE) IMPLANT
GLOVE EXAM NITRILE MD LF STRL (GLOVE) IMPLANT
GLOVE EXAM NITRILE XL STR (GLOVE) IMPLANT
GLOVE EXAM NITRILE XS STR PU (GLOVE) IMPLANT
GLOVE INDICATOR 7.0 STRL GRN (GLOVE) ×3 IMPLANT
GLOVE INDICATOR 8.5 STRL (GLOVE) ×4 IMPLANT
GLOVE SS BIOGEL STRL SZ 6.5 (GLOVE) IMPLANT
GLOVE SUPERSENSE BIOGEL SZ 6.5 (GLOVE) ×3
GOWN BRE IMP SLV AUR LG STRL (GOWN DISPOSABLE) ×1 IMPLANT
GOWN BRE IMP SLV AUR XL STRL (GOWN DISPOSABLE) ×5 IMPLANT
GOWN STRL REIN 2XL LVL4 (GOWN DISPOSABLE) IMPLANT
HEMOSTAT POWDER SURGIFOAM 1G (HEMOSTASIS) ×1 IMPLANT
KIT BASIN OR (CUSTOM PROCEDURE TRAY) ×2 IMPLANT
KIT ROOM TURNOVER OR (KITS) ×2 IMPLANT
MILL MEDIUM DISP (BLADE) ×1 IMPLANT
NEEDLE HYPO 25X1 1.5 SAFETY (NEEDLE) ×2 IMPLANT
NS IRRIG 1000ML POUR BTL (IV SOLUTION) ×2 IMPLANT
PACK LAMINECTOMY NEURO (CUSTOM PROCEDURE TRAY) ×2 IMPLANT
PAD ARMBOARD 7.5X6 YLW CONV (MISCELLANEOUS) ×10 IMPLANT
PATTIES SURGICAL .5 X.5 (GAUZE/BANDAGES/DRESSINGS) ×1 IMPLANT
PATTIES SURGICAL 1X1 (DISPOSABLE) ×1 IMPLANT
PUTTY BONE DBX 5CC MIX (Putty) ×1 IMPLANT
ROD CURVED 5.5MMX75MM (Rod) ×1 IMPLANT
ROD CURVED 5.5MMX85MM (Rod) ×1 IMPLANT
SCREW PEDICLE 6.5MMX45MM (Screw) ×4 IMPLANT
SCREW PEDICLE 6.5X40MM (Screw) ×4 IMPLANT
SCREW PEDICLE 6.5X45 (Screw) ×2 IMPLANT
SPACER CALIBER 10X22 9-13MM-12 (Spacer) ×4 IMPLANT
SPACER CALIBER 10X26MM 11-15MM (Spacer) ×2 IMPLANT
SPONGE GAUZE 4X4 12PLY (GAUZE/BANDAGES/DRESSINGS) ×2 IMPLANT
SPONGE LAP 4X18 X RAY DECT (DISPOSABLE) IMPLANT
SPONGE SURGIFOAM ABS GEL 100 (HEMOSTASIS) ×4 IMPLANT
STRIP CLOSURE SKIN 1/2X4 (GAUZE/BANDAGES/DRESSINGS) ×4 IMPLANT
SUT PROLENE 6 0 BV (SUTURE) ×4 IMPLANT
SUT VIC AB 0 CT1 18XCR BRD8 (SUTURE) ×1 IMPLANT
SUT VIC AB 0 CT1 8-18 (SUTURE) ×2
SUT VIC AB 2-0 CT1 18 (SUTURE) ×2 IMPLANT
SUT VICRYL 4-0 PS2 18IN ABS (SUTURE) ×2 IMPLANT
SYR 20ML ECCENTRIC (SYRINGE) ×2 IMPLANT
TOWEL OR 17X24 6PK STRL BLUE (TOWEL DISPOSABLE) ×2 IMPLANT
TOWEL OR 17X26 10 PK STRL BLUE (TOWEL DISPOSABLE) ×2 IMPLANT
TRAY FOLEY CATH 14FRSI W/METER (CATHETERS) ×2 IMPLANT
WATER STERILE IRR 1000ML POUR (IV SOLUTION) ×2 IMPLANT

## 2013-01-16 NOTE — Preoperative (Signed)
Beta Blockers   Reason not to administer Beta Blockers:Not Applicable 

## 2013-01-16 NOTE — Anesthesia Preprocedure Evaluation (Signed)
Anesthesia Evaluation  Patient identified by MRN, date of birth, ID band Patient awake    Reviewed: Allergy & Precautions, H&P , NPO status , Patient's Chart, lab work & pertinent test results  Airway Mallampati: II      Dental  (+) Teeth Intact and Dental Advisory Given   Pulmonary  breath sounds clear to auscultation        Cardiovascular Rhythm:Regular     Neuro/Psych    GI/Hepatic   Endo/Other    Renal/GU      Musculoskeletal   Abdominal   Peds  Hematology   Anesthesia Other Findings   Reproductive/Obstetrics                           Anesthesia Physical Anesthesia Plan  ASA: II  Anesthesia Plan: General   Post-op Pain Management:    Induction: Intravenous  Airway Management Planned: Oral ETT  Additional Equipment:   Intra-op Plan:   Post-operative Plan: Extubation in OR  Informed Consent: I have reviewed the patients History and Physical, chart, labs and discussed the procedure including the risks, benefits and alternatives for the proposed anesthesia with the patient or authorized representative who has indicated his/her understanding and acceptance.   Dental advisory given  Plan Discussed with: CRNA and Surgeon  Anesthesia Plan Comments: (Lumbar spinal stenosis H/O Htn )        Anesthesia Quick Evaluation

## 2013-01-16 NOTE — Progress Notes (Signed)
Pt. Complaining of left eye pain, DR, Noreene Larsson and DR Wynetta Emery aware, eye drops ordered.

## 2013-01-16 NOTE — Anesthesia Postprocedure Evaluation (Signed)
  Anesthesia Post-op Note  Patient: Terri [name]  Procedure(s) Performed: Procedure(s): Decompression Lumbar Laminectomy and Fusion at Lumbar Three-Four,Lumbar Four-Five. (Bilateral)  Patient Location: PACU  Anesthesia Type:General  Level of Consciousness: awake, alert  and oriented  Airway and Oxygen Therapy: Patient Spontanous Breathing and Patient connected to nasal cannula oxygen  Post-op Pain: mild  Post-op Assessment: Post-op Vital signs reviewed, Patient's Cardiovascular Status Stable, Respiratory Function Stable, Patent Airway, No signs of Nausea or vomiting and Pain level controlled  Post-op Vital Signs: stable  Complications: possible eye injury

## 2013-01-16 NOTE — Progress Notes (Signed)
72 year old female S/P L3-4, L4-5 decompression and fusion today. Suregry lasted 5 1/2 hours. Upon emergence she complained of L. Eye pain, vision OK. Conjunctiva red.   Impression: Probable post-op corneal abrasion  Plan: Prednisone eye drops per Dr. Wynetta Emery,  Follow-up in AM, if symptoms persist, consider opthalmology consult.  Kipp Brood, MD

## 2013-01-16 NOTE — Anesthesia Procedure Notes (Addendum)
Procedure Name: Intubation Date/Time: 01/16/2013 7:50 AM Performed by: Fransisca Kaufmann Pre-anesthesia Checklist: Timeout performed, Patient identified, Emergency Drugs available, Suction available and Patient being monitored Patient Re-evaluated:Patient Re-evaluated prior to inductionOxygen Delivery Method: Circle system utilized Preoxygenation: Pre-oxygenation with 100% oxygen Intubation Type: IV induction Ventilation: Mask ventilation without difficulty Laryngoscope Size: 2 Grade View: Grade I Tube type: Oral Tube size: 7.0 mm Number of attempts: 1 Airway Equipment and Method: Stylet Placement Confirmation: ETT inserted through vocal cords under direct vision,  positive ETCO2 and breath sounds checked- equal and bilateral Secured at: 21 cm Tube secured with: Tape Dental Injury: Teeth and Oropharynx as per pre-operative assessment  Comments: Pt states pre-op she has sore mouth from biting in her sleep with CPAP on, throat sore pre-op also. Discussed that she may notice some intensity of symptoms post op.

## 2013-01-16 NOTE — H&P (Signed)
Terri [name] is an 72 y.o. female.   Chief Complaint: Back and right leg pain HPI: Patient is a 72 year old female whose had long-standing back and right leg pain rating down the front of her shin top of her foot consistent with both an L4 and L5 nerve root pattern. This is been going on for several years she failed all forms of conservative treatment with anti-inflammatories physical therapy epidural steroid injections and time. Workup with serial MRI scans show progressive degenerative disease and progressive degenerative scoliosis and lumbar spinal stenosis with severe compression of both the L3-L4 and L5 nerve roots bilaterally. Due to her failure conservative treatment imaging findings and progression of clinical syndrome I recommended decompression stabilization procedure at those levels I went over the risks and benefits of the operation with her as well as peri-o0perative course and expectations of outcome and alternatives to surgery and she understood and agreed to proceed forward.  Past Medical History  Diagnosis Date  . Complication of anesthesia     "states had coughing, chest pain, difficulty swallowing after going home, saw by [facility] doctor, issue resolved with medicine"  . PONV (postoperative nausea and vomiting)   . Heart murmur     "mild heart murmur, sees Dr. Mollie Germany"  . Hyperlipidemia     hx of  . Hypertension     hx of, "not on medication at this time" followed by Dr. Sindy Guadeloupe"  . Depression     on medication x 2 years  . Bronchitis     hx of  . Seasonal allergies     hx of  . Pneumonia     "as a child"  . Sleep apnea     uses cpap machine, last sleep study over 5 years ago  . Bladder disorder     "has bladder stem dilated approx. every 6 months"  . GERD (gastroesophageal reflux disease)     "not on any medication at this time"  . Cancer     skin ca lesions removed  . Arthritis   . Spinal stenosis     hx of  . Diverticulosis     also history of  colon polyps  . Cough     hx of recent cough, cold, sinus drainage, on antibiotics at this time"    Past Surgical History  Procedure Laterality Date  . Carpal tunnel release      hx of  . Eye surgery      bilateral cataract surgery, hx of macular hole repair  . Lipoma excision      hx of  . Ganglion cyst excision      top of right foot  . Breast surgery      breast biopsy done  . Superficial lymph node biopsy / excision    . Cardiovascular stress test      hx of    History reviewed. No pertinent family history. Social History:  reports that she has never smoked. She does not have any smokeless tobacco history on file. She reports that she does not drink alcohol or use illicit drugs.  Allergies:  Allergies  Allergen Reactions  . Ciprofloxacin Other (See Comments)    unknown  . Darvon (Propoxyphene Hcl) Nausea And Vomiting  . Inderal (Propranolol) Nausea And Vomiting  . Erythromycin Rash    Medications Prior to Admission  Medication Sig Dispense Refill  . amoxicillin (AMOXIL) 875 MG tablet Take 875 mg by mouth 2 (two) times daily.      Marland Kitchen  Calcium Carbonate-Vitamin D (CALCIUM 600 + D PO) Take 1 tablet by mouth daily.      . citalopram (CELEXA) 20 MG tablet Take 20 mg by mouth daily.      . mometasone (NASONEX) 50 MCG/ACT nasal spray Place 2 sprays into the nose daily as needed. For allergies      . naproxen sodium (ANAPROX) 220 MG tablet Take 440 mg by mouth daily as needed. For pain      . nitrofurantoin, macrocrystal-monohydrate, (MACROBID) 100 MG capsule Take 100 mg by mouth 2 (two) times daily. To finish course on 01/19/13. Per patient for sore throat.      Marland Kitchen OVER THE COUNTER MEDICATION Take 1 tablet by mouth daily as needed. Store brand allergy medication  For allergies      . simvastatin (ZOCOR) 40 MG tablet Take 40 mg by mouth every evening.        No results found for this or any previous visit (from the past 48 hour(s)). No results found.  Review of Systems   Constitutional: Negative.   HENT: Negative.   Eyes: Negative.   Respiratory: Negative.   Cardiovascular: Negative.   Gastrointestinal: Negative.   Genitourinary: Negative.   Musculoskeletal: Positive for myalgias, back pain and joint pain.  Skin: Negative.   Neurological: Positive for tingling and sensory change.  Psychiatric/Behavioral: Negative.     Blood pressure 154/72, pulse 57, temperature 98.4 F (36.9 C), temperature source Oral, resp. rate 18, SpO2 95.00%. Physical Exam  Constitutional: She is oriented to person, place, and time. She appears well-developed and well-nourished.  HENT:  Head: Normocephalic.  Eyes: Pupils are equal, round, and reactive to light.  Neck: Normal range of motion.  Cardiovascular: Normal rate.   Respiratory: Effort normal.  GI: Soft.  Neurological: She is alert and oriented to person, place, and time. She has normal strength. GCS eye subscore is 4. GCS verbal subscore is 5. GCS motor subscore is 6.  Reflex Scores:      Brachioradialis reflexes are 1+ on the right side and 1+ on the left side.      Patellar reflexes are 1+ on the right side and 1+ on the left side.      Achilles reflexes are 1+ on the right side and 1+ on the left side. 5 out of 5 in her iliopsoas, quads, hamstrings, gastrocs, anterior tibialis, and EHL.     Assessment/Plan 71 year old presents for a L3-4 and L4-5 posterior lumbar interbody fusion  Terri [name] P 01/16/2013, 7:19 AM

## 2013-01-16 NOTE — Progress Notes (Signed)
See previous progress note.

## 2013-01-16 NOTE — Progress Notes (Signed)
Patient wants to hold off on cpap tonight to see if insurance will pay for it. RT will continue to monitor.

## 2013-01-16 NOTE — Op Note (Signed)
Preoperative diagnosis: Degenerative scoliosis lumbar spinal stenosis L3-4 L4-5 with bilateral L3, L4, and L5 radiculopathies  Postoperative diagnosis: Same  Procedure: Decompressive lumbar laminectomy in excess of what would be needed with a standard interbody fusion at L3-4 and L4-5  #2 posterior lumbar interbody fusion L3-4 and L4-5  #3 posterior lateral arthrodesis L3-4 L4-5  #4 pedicle screw fixation L3-L5 using the 5.5 globus Revere pedicle screw system  #6 reduction spinal deformity  #7 placement of a Hemovac drain  #8 repair of inadvertent dural tear  Anesthesia Gen. endotracheal  Surgeon Jillyn Hidden Rihana Kiddy  Asst. Shirlean Kelly  History of present illness: Patient is a very pleasant 72 year old female is a progress worsening back and right greater left leg pain rating down to her knee as well as pronation top of the consistent with both L3-L4 and L5 nerve root bilaterally. Patient failed all forms of conservative treatment with anti-inflammatories physical therapy epidural steroid injections and time workup revealed progressive degenerative scoliosis and lumbar spinal stenosis with severe foraminal stenosis of the L3, L4, and L5 nerve roots to this mixture treatment imaging findings and progression of clinical syndrome I recommended a decompression stabilization procedure at L3-4 and L4-5 over the risks benefits of the operation with her she understood and agreed to proceed forward.  Operative procedure: Patient brought into the or was induced under general anesthesia positioned prone the Wilson frame her back was prepped and draped in routine sterile fashion. Preoperative x-ray localize the appropriate level and then after infiltration 10 cc lidocaine with epi a midline incision was made and Bovie cautery was used to dissect subperiosteally 6 care lamina of L3-4 and L5. TPS were exposed at 34 and 5. Interoperative X. identify the appropriate level the spinous processes at L3 and L4 were  then removed central decompression was begun. Marked hourglass compression of thecal sac was noted especially at L4-5 so attention second the decompression L3-4 first complete medial facetectomies were performed at L3-4 this allowed identification of the 3 and the L4 nerve root they were aggressively skeletonized and the foramina were unroofed aggressive abutting super to cutting facet complex lichenification lateral disc space in the marching inferiorly the L4-5 levels addressed with complete medial facetectomies performed there as well. There was marked stenosis on the L4 and L5 nerve root from the severe facet arthropathy at this level. After adequate decompression and foraminotomies were widely patent. Except for was clean created stenosis and degenerative collapse. So attention was first taken to placement or a drill of the pedicle screws. Is a high-speed drill was used to drill down the pedicle at L3 cannulated with the awl probed O55 Probed again a 6 5 x 45 screw inserted here. Fluoroscopy used the simple the way as well as direct intra-or external bony landmarks improvement within the pedicle. This confirm no mediolateral breech. Then taken to pedicle screw placement at L4 and L5-1 6 x 40 screws inserted there. The inset was inserted similar fashion. After all after adequate position of the pedicle screws at T10 to the interbody work the spaces were cleaned out bilaterally size 10 distractor was inserted L4-5 on the right using a 9 rotating cutter and Epstein curettes the specimen place (the left several arthritis paranasal removed the 9 expandable cage was inserted after being packed with local are graft mixed DBX and expanded to approximately 12 mm. Then the distractors removed the second L3-4 in a similar fashion adequate endplate preparation was achieved and left L3-4 and a 11 mm cage was  placed and expanded up to about 14 mm. Then in the similar fashion the right side endplates were prepared large  central disc was removed Epstein curettes and a rotating cutters used to repair the endplates of both levels. Then cages were inserted respectively after local autograft was packed centrally. At the end of all implants the AP and lateral fluoroscopy confirmed good position implants screws and cages additional graft was packed lateral to the cages each level and the scope was irrigated meticulous in space was maintained there was during the decompression a split-thickness dural tear dorsally and centrally at L4-5 this was repaired primarily with 6-0 Prolene and confirmed adequate closure with Valsalva. Then aggressive decortication was care MTPs or lateral gutters the local graft pack posterior laterally and an 885 mm rod was placed on the right and 75 in the left top tightness tightened down locked in place across it was applied. DuraSeal was overlaid top of the dural tear all the foraminal region coronary dilator confirm patency no migration of graft material Gelfoam was laid up the dura and the wounds closed in layers with after Vicryl the skin was) 4 subcuticular benzoin Steri-Strips applied patient recovered in stable condition. At the case on it counts sponge counts were correct.

## 2013-01-16 NOTE — Transfer of Care (Signed)
Immediate Anesthesia Transfer of Care Note  Patient: Terri [name]  Procedure(s) Performed: Procedure(s): Decompression Lumbar Laminectomy and Fusion at Lumbar Three-Four,Lumbar Four-Five. (Bilateral)  Patient Location: PACU  Anesthesia Type:General  Level of Consciousness: awake, alert , oriented and sedated  Airway & Oxygen Therapy: Patient Spontanous Breathing and Patient connected to nasal cannula oxygen  Post-op Assessment: Report given to PACU RN, Post -op Vital signs reviewed and stable and Patient moving all extremities  Post vital signs: Reviewed and stable  Complications: No apparent anesthesia complications

## 2013-01-17 LAB — CBC
MCH: 31.7 pg (ref 26.0–34.0)
MCV: 91.9 fL (ref 78.0–100.0)
Platelets: 81 10*3/uL — ABNORMAL LOW (ref 150–400)
RDW: 13.4 % (ref 11.5–15.5)

## 2013-01-17 LAB — BASIC METABOLIC PANEL
CO2: 28 mEq/L (ref 19–32)
Calcium: 7.9 mg/dL — ABNORMAL LOW (ref 8.4–10.5)
Creatinine, Ser: 0.58 mg/dL (ref 0.50–1.10)
GFR calc Af Amer: >90 mL/min (ref >90)

## 2013-01-17 MED ORDER — HYDROMORPHONE 0.3 MG/ML IV SOLN
INTRAVENOUS | Status: AC
  Filled 2013-01-17: qty 25

## 2013-01-17 MED ORDER — ONDANSETRON HCL 4 MG/2ML IJ SOLN: 4.0000 mg | Freq: Four times a day (QID) | INTRAMUSCULAR | Status: DC | PRN

## 2013-01-17 MED ORDER — SODIUM CHLORIDE 0.9 % IJ SOLN: 9.0000 mL | INTRAMUSCULAR | Status: DC | PRN

## 2013-01-17 MED ORDER — DIPHENHYDRAMINE HCL 12.5 MG/5ML PO ELIX
12.5000 mg | ORAL_SOLUTION | Freq: Four times a day (QID) | ORAL | Status: DC | PRN
  Filled 2013-01-17: qty 5

## 2013-01-17 MED ORDER — HYDROMORPHONE 0.3 MG/ML IV SOLN
INTRAVENOUS | Status: DC
  Administered 2013-01-17: 3.6 mg via INTRAVENOUS
  Administered 2013-01-17 (×2): via INTRAVENOUS
  Administered 2013-01-17: 2.7 mg via INTRAVENOUS
  Administered 2013-01-18: 1.2 mg via INTRAVENOUS
  Administered 2013-01-18: 1.416 mg via INTRAVENOUS
  Administered 2013-01-18: 1.2 mg via INTRAVENOUS
  Administered 2013-01-18: 1.8 mg via INTRAVENOUS
  Administered 2013-01-18: 1.2 mg via INTRAVENOUS
  Administered 2013-01-19: 0.3 mg via INTRAVENOUS
  Filled 2013-01-17: qty 25

## 2013-01-17 MED ORDER — NALOXONE HCL 0.4 MG/ML IJ SOLN: 0.4000 mg | INTRAMUSCULAR | Status: DC | PRN

## 2013-01-17 MED ORDER — DIPHENHYDRAMINE HCL 50 MG/ML IJ SOLN: 12.5000 mg | Freq: Four times a day (QID) | INTRAMUSCULAR | Status: DC | PRN

## 2013-01-17 NOTE — Progress Notes (Signed)
Subjective: Patient reports She's 57 a lot of back pain but no leg pain no new numbness or tingling and no headache  Objective: Vital signs in last 24 hours: Temp:  [97 F (36.1 C)-99.3 F (37.4 C)] 99.3 F (37.4 C) (02/18 0402) Pulse Rate:  [66-88] 75 (02/18 0900) Resp:  [5-22] 18 (02/18 0900) BP: (119-168)/(51-117) 138/59 mmHg (02/18 0900) SpO2:  [91 %-100 %] 96 % (02/18 0900) Weight:  [69.4 kg (153 lb)] 69.4 kg (153 lb) (02/17 1700)  Intake/Output from previous day: 02/17 0701 - 02/18 0700 In: 8911.3 [I.V.:5986.3; Blood:1560; IV Piggyback:750] Out: 4098 [Urine:1085; Blood:2500] Intake/Output this shift: Total I/O In: 150 [I.V.:150] Out: 70 [Urine:70]  Awake alert strength 5 out of 5 wound is clean and dry  Lab Results:  Recent Labs  01/16/13 1324 01/17/13 0450  WBC 9.6 7.4  HGB 13.2 12.5  HCT 38.7 36.2  PLT 86* 81*   BMET  Recent Labs  01/16/13 1705 01/17/13 0450  NA 140 137  K 4.0 4.3  CL 106 103  CO2 25 28  GLUCOSE 136* 133*  BUN 13 9  CREATININE 0.64 0.58  CALCIUM 7.6* 7.9*    Studies/Results: Dg Lumbar Spine 2-3 Views  01/16/2013  *RADIOLOGY REPORT*  Clinical Data: L3-5 fusion.  DG C-ARM GT 120 MIN, LUMBAR SPINE - 2-3 VIEW  Technique: Two fluoroscopic spot views of the lower lumbar spine are provided.  Comparison:  None.  Findings: Images demonstrate pedicle screws with interbody spacers from L3-L5.  IMPRESSION: L3-5 PLIF in progress.   Original Report Authenticated By: Holley Dexter, M.D.    Dg C-arm Gt 120 Min  01/16/2013  *RADIOLOGY REPORT*  Clinical Data: L3-5 fusion.  DG C-ARM GT 120 MIN, LUMBAR SPINE - 2-3 VIEW  Technique: Two fluoroscopic spot views of the lower lumbar spine are provided.  Comparison:  None.  Findings: Images demonstrate pedicle screws with interbody spacers from L3-L5.  IMPRESSION: L3-5 PLIF in progress.   Original Report Authenticated By: Holley Dexter, M.D.     Assessment/Plan: I DC'd her Hemovac we'll slowly  raised the head of her bed today after lunch most likely get her out of bed tomorrow  LOS: 1 day     Pegi Milazzo P 01/17/2013, 9:52 AM

## 2013-01-17 NOTE — Progress Notes (Signed)
Patient did not want to wear cpap tonight. RT will continue to monitor. 

## 2013-01-18 ENCOUNTER — Encounter (HOSPITAL_COMMUNITY): Payer: Self-pay | Admitting: General Practice

## 2013-01-18 LAB — BASIC METABOLIC PANEL
BUN: 7 mg/dL (ref 6–23)
Chloride: 95 mEq/L — ABNORMAL LOW (ref 96–112)
GFR calc Af Amer: >90 mL/min (ref >90)
Potassium: 4 mEq/L (ref 3.5–5.1)

## 2013-01-18 LAB — CBC WITH DIFFERENTIAL/PLATELET
Basophils Relative: 0 % (ref 0–1)
Hemoglobin: 12.2 g/dL (ref 12.0–15.0)
Lymphs Abs: 0.8 10*3/uL (ref 0.7–4.0)
MCHC: 35.2 g/dL (ref 30.0–36.0)
Monocytes Relative: 9 % (ref 3–12)
Neutro Abs: 9.3 10*3/uL — ABNORMAL HIGH (ref 1.7–7.7)
Neutrophils Relative %: 84 % — ABNORMAL HIGH (ref 43–77)
RBC: 3.85 MIL/uL — ABNORMAL LOW (ref 3.87–5.11)
WBC: 11 10*3/uL — ABNORMAL HIGH (ref 4.0–10.5)

## 2013-01-18 NOTE — Clinical Social Work Note (Addendum)
Clinical Social Work Department CLINICAL SOCIAL WORK PLACEMENT NOTE 01/18/2013  Patient:  Terri [name], Terri [name]  Account Number:  [id number] Admit date:  01/16/2013  Clinical Social Worker:  Macario Golds, LCSW  Date/time:  01/18/2013 02:30 PM  Clinical Social Work is seeking post-discharge placement for this patient at the following level of care:   SKILLED NURSING   (*CSW will update this form in Epic as items are completed)   01/18/2013  Patient/family provided with [facility] Department of Clinical Social Work's list of facilities offering this level of care within the geographic area requested by the patient (or if unable, by the patient's family).  01/18/2013  Patient/family informed of their freedom to choose among providers that offer the needed level of care, that participate in Medicare, Medicaid or managed care program needed by the patient, have an available bed and are willing to accept the patient.  01/18/2013  Patient/family informed of [facility]' ownership interest in [facility], as well as of the fact that they are under no obligation to receive care at this facility.  PASARR submitted to E[employer] on 01/18/2013 PASARR number received from [employer] on 01/18/2013  FL2 transmitted to all facilities in geographic area requested by pt/family on  01/19/2013 FL2 transmitted to all facilities within larger geographic area on   Patient informed that his/her managed care company has contracts with or will negotiate with  certain facilities, including the following:     Patient/family informed of bed offers received:  01/20/2013 Patient chooses bed at [facility] Physician recommends and patient chooses bed at  [facility]  Patient to be transferred to [facility] on 01/24/2013  Patient to be transferred to facility by pt's family.  The following physician request were entered in Epic:   Additional Comments:  Dede Query, MSW, Theresia Majors [phone]

## 2013-01-18 NOTE — Progress Notes (Signed)
Subjective: Patient reports She's feeling okay she still back pain she's not having any leg pain she'll abdominal cramping earlier but this resolved and she doesn't difficulty urinating the  Objective: Vital signs in last 24 hours: Temp:  [98.4 F (36.9 C)-99 F (37.2 C)] 98.6 F (37 C) (02/19 0405) Pulse Rate:  [68-84] 74 (02/19 0800) Resp:  [10-22] 16 (02/19 0800) BP: (121-166)/(48-84) 151/66 mmHg (02/19 0800) SpO2:  [95 %-100 %] 100 % (02/19 0800)  Intake/Output from previous day: 02/18 0701 - 02/19 0700 In: 1590 [I.V.:1590] Out: 1360 [Urine:1360] Intake/Output this shift: Total I/O In: 75 [I.V.:75] Out: -   Awake alert strength is 5 out of 5 wound is clean and dry no evidence of drainage we'll progressively mobilize her out of bed and get her with physical outpatient therapy today  Lab Results:  Recent Labs  01/16/13 1324 01/17/13 0450  WBC 9.6 7.4  HGB 13.2 12.5  HCT 38.7 36.2  PLT 86* 81*   BMET  Recent Labs  01/16/13 1705 01/17/13 0450  NA 140 137  K 4.0 4.3  CL 106 103  CO2 25 28  GLUCOSE 136* 133*  BUN 13 9  CREATININE 0.64 0.58  CALCIUM 7.6* 7.9*    Studies/Results: Dg Lumbar Spine 2-3 Views  01/16/2013  *RADIOLOGY REPORT*  Clinical Data: L3-5 fusion.  DG C-ARM GT 120 MIN, LUMBAR SPINE - 2-3 VIEW  Technique: Two fluoroscopic spot views of the lower lumbar spine are provided.  Comparison:  None.  Findings: Images demonstrate pedicle screws with interbody spacers from L3-L5.  IMPRESSION: L3-5 PLIF in progress.   Original Report Authenticated By: Holley Dexter, M.D.    Dg C-arm Gt 120 Min  01/16/2013  *RADIOLOGY REPORT*  Clinical Data: L3-5 fusion.  DG C-ARM GT 120 MIN, LUMBAR SPINE - 2-3 VIEW  Technique: Two fluoroscopic spot views of the lower lumbar spine are provided.  Comparison:  None.  Findings: Images demonstrate pedicle screws with interbody spacers from L3-L5.  IMPRESSION: L3-5 PLIF in progress.   Original Report Authenticated By: Holley Dexter, M.D.     Assessment/Plan: Physical therapy outpatient therapy replace the Foley get her out of bed  LOS: 2 days     Romina Divirgilio P 01/18/2013, 8:17 AM

## 2013-01-18 NOTE — Progress Notes (Signed)
Anesthesiology Follow-up:  As noted,  following surgery on 2/17, Ms. Morera complained of L. Eye pain with redness in PACU. Pain and redness now resolved. Vision subjectively at baseline.  Impression: Possible post-op L. corneal abrasion following surgery 2/17. Now clinically resolved.  Kipp Brood, MD

## 2013-01-18 NOTE — Clinical Social Work Note (Signed)
Clinical Social Work Department BRIEF PSYCHOSOCIAL ASSESSMENT 01/18/2013  Patient:  Terri [name], Terri [name]     Account Number:  [id number]     Admit date:  01/16/2013  Clinical Social Worker:  Verl Blalock  Date/Time:  01/18/2013 02:30 PM  Referred by:  RN  Date Referred:  01/18/2013 Referred for  SNF Placement   Other Referral:   Interview type:  Patient Other interview type:   Spoke with patient daughter in the hall with patient permission    PSYCHOSOCIAL DATA Living Status:  HUSBAND Admitted from facility:   Level of care:   Primary support name:  Haggerty,Elaine  [phone] Primary support relationship to patient:  CHILD, ADULT Degree of support available:   Strong    CURRENT CONCERNS Current Concerns  Post-Acute Placement   Other Concerns:    SOCIAL WORK ASSESSMENT / PLAN Clinical Social Worker met with patient and daughter at bedside to offer support and discuss patient needs at discharge.  Patient and daughter state that patient currently lives at home with her husband.  Patient husband is debilitated due to previous stroke with a caregiver in the home Monday -Friday from 8-5.  Patient cares for husband in the evenings and on the weekend as needed. Patient is motivated and agreeable to participate in rehab prior to return home.    Patient and patient daughter understanding of discharge options for patient once medically ready.  Patient and patient daughter are hopeful for inpatient rehab but agreeable to SNF placement if needed.  CSW to complete FL2 and initiate search in [facility] - follow up with patient and daughter regarding possible bed offers once available.  CSW remains available for support and to facilitate appropriate discharge needs once medically stable.   Assessment/plan status:  Psychosocial Support/Ongoing Assessment of Needs Other assessment/ plan:   Information/referral to community resources:   Clinical Social Worker offered facility list,  however patient and patient daughter are hopeful for inpatient rehab.  Patient daughter states that they will make arrangements with current caregiver for weekend coverage as needed once patient is able to return home.    PATIENT'S/FAMILY'S RESPONSE TO PLAN OF CARE: Patient is alert and oriented x3 laying in the bed. Patient was sleeping upon CSW arrival but was willing to wake up and engage in conversation.  Patient expressed her willingness to keep her family involved in conversations as needed.  Patient and patient daughter are willing to make arrangements to be sure that patient will be able to return home.  Patient and patient daughter verbalized appreciation for CSW support and concern.

## 2013-01-18 NOTE — Progress Notes (Signed)
Pt declined CPAP tonight to to having sore throat. Rt will monitor and assess as needed.

## 2013-01-19 LAB — TYPE AND SCREEN
Antibody Screen: NEGATIVE
Unit division: 0
Unit division: 0
Unit division: 0

## 2013-01-19 MED ORDER — DOCUSATE SODIUM 100 MG PO CAPS
100.0000 mg | ORAL_CAPSULE | Freq: Every day | ORAL | Status: DC
  Administered 2013-01-19 – 2013-01-24 (×6): 100 mg via ORAL
  Filled 2013-01-19 (×5): qty 1

## 2013-01-19 MED ORDER — BISACODYL 10 MG RE SUPP
10.0000 mg | Freq: Every day | RECTAL | Status: DC | PRN
  Administered 2013-01-20 – 2013-01-23 (×2): 10 mg via RECTAL
  Filled 2013-01-19 (×2): qty 1

## 2013-01-19 MED FILL — Sodium Chloride IV Soln 0.9%: INTRAVENOUS | Qty: 3000 | Status: AC

## 2013-01-19 MED FILL — Heparin Sodium (Porcine) Inj 1000 Unit/ML: INTRAMUSCULAR | Qty: 30 | Status: AC

## 2013-01-19 NOTE — Evaluation (Signed)
Physical Therapy Evaluation Patient Details Name: Terri [name] MRN: [id number] DOB: [birth year] Today's Date: 01/19/2013 Time: 4098-1191 PT Time Calculation (min): 27 min  PT Assessment / Plan / Recommendation Clinical Impression  Pt. was admittied due to back issues and she underwent PLIF L 3-5.  She has had some post op confusion and disorientation, and has a decrease in her usual independent  level of functional mobility and gait.  She will benefit from acute PT to address this and below issues.  Recommend ST SNF for rehab prior to DC home.    PT Assessment  Patient needs continued PT services    Follow Up Recommendations  SNF;Supervision/Assistance - 24 hour;Supervision for mobility/OOB    Does the patient have the potential to tolerate intense rehabilitation      Barriers to Discharge Decreased caregiver support      Equipment Recommendations  Rolling walker with 5" wheels    Recommendations for Other Services     Frequency Min 5X/week    Precautions / Restrictions Precautions Precautions: Back;Fall Precaution Booklet Issued: Yes (comment) Precaution Comments: back precautions explained to pt. Required Braces or Orthoses: Spinal Brace Spinal Brace: Lumbar corset;Applied in sitting position;Other (comment) (brace had been applied by nursing tech prior to PT/OT) Restrictions Weight Bearing Restrictions: No   Pertinent Vitals/Pain 10/10 in low back; RN in with pain med, pt. postiioned in chair for comfort.      Mobility  Bed Mobility Bed Mobility: Not assessed (pat. presented seatedat edge of bed) Transfers Transfers: Sit to Stand;Stand to Sit Sit to Stand: 3: Mod assist;From bed;With upper extremity assist Stand to Sit: 4: Min assist;With upper extremity assist;To chair/3-in-1 Details for Transfer Assistance: Pt. needed repeated cueing on correct hand placement and technique/precautions Ambulation/Gait Ambulation/Gait Assistance: 3: Mod assist Ambulation  Distance (Feet): 50 Feet Assistive device: Rolling walker Ambulation/Gait Assistance Details: Pt. needed mod assist to advance RW, as she was unable to direct this herself.  Min. assist for balance and  stability. Gait Pattern: Step-through pattern;Decreased step length - right;Decreased step length - left;Decreased hip/knee flexion - right;Decreased hip/knee flexion - left Gait velocity: slowed Stairs: No Wheelchair Mobility Wheelchair Mobility: No    Exercises     PT Diagnosis: Difficulty walking;Abnormality of gait;Acute pain  PT Problem List: Decreased activity tolerance;Decreased balance;Decreased mobility;Decreased knowledge of use of DME;Decreased safety awareness;Decreased knowledge of precautions;Pain PT Treatment Interventions: DME instruction;Gait training;Functional mobility training;Therapeutic activities;Balance training;Patient/family education   PT Goals Acute Rehab PT Goals PT Goal Formulation: With patient Time For Goal Achievement: 01/26/13 Potential to Achieve Goals: Good Pt will Roll Supine to Right Side: with modified independence PT Goal: Rolling Supine to Right Side - Progress: Goal set today Pt will Roll Supine to Left Side: with modified independence PT Goal: Rolling Supine to Left Side - Progress: Goal set today Pt will go Supine/Side to Sit: with modified independence PT Goal: Supine/Side to Sit - Progress: Goal set today Pt will go Sit to Supine/Side: with modified independence PT Goal: Sit to Supine/Side - Progress: Goal set today Pt will go Sit to Stand: with supervision PT Goal: Sit to Stand - Progress: Goal set today Pt will go Stand to Sit: with supervision PT Goal: Stand to Sit - Progress: Goal set today Pt will Transfer Bed to Chair/Chair to Bed: with supervision PT Transfer Goal: Bed to Chair/Chair to Bed - Progress: Goal set today Pt will Ambulate: 51 - 150 feet;with supervision;with rolling walker Additional Goals Additional Goal #1: Pt. will  state and  adhere to 3/3 back precautions and log rolling technique PT Goal: Additional Goal #1 - Progress: Goal set today  Visit Information  Last PT Received On: 01/19/13 Assistance Needed: +1 PT/OT Co-Evaluation/Treatment: Yes    Subjective Data  Subjective: Pt. first reported that her husband has a personal care aid only while she is in the hospital, later reported that the aide will still be there when she Dc's home. Patient Stated Goal: wants to resume being able to take care of husband   Prior Functioning  Home Living Lives With: Spouse Available Help at Discharge: Available PRN/intermittently Type of Home: House Prior Function Level of Independence: Independent Vocation: Retired Musician: No difficulties Dominant Hand: Right    Cognition  Cognition Overall Cognitive Status: Impaired Arousal/Alertness: Awake/alert Orientation Level: Disoriented to;Place;Time;Situation Behavior During Session: WFL for tasks performed    Extremity/Trunk Assessment Right Upper Extremity Assessment RUE ROM/Strength/Tone: [facility] for tasks assessed Left Upper Extremity Assessment LUE ROM/Strength/Tone: WFL for tasks assessed Right Lower Extremity Assessment RLE ROM/Strength/Tone: WFL for tasks assessed RLE Sensation: WFL - Light Touch RLE Coordination: WFL - gross/fine motor Left Lower Extremity Assessment LLE ROM/Strength/Tone: WFL for tasks assessed LLE Sensation: WFL - Light Touch LLE Coordination: WFL - gross/fine motor Trunk Assessment Trunk Assessment: Normal   Balance Balance Balance Assessed: Yes Static Sitting Balance Static Sitting - Balance Support: No upper extremity supported;Feet supported Static Sitting - Level of Assistance: 6: Modified independent (Device/Increase time) Static Standing Balance Static Standing - Balance Support: Bilateral upper extremity supported;During functional activity Static Standing - Level of Assistance: 4: Min assist  End  of Session PT - End of Session Equipment Utilized During Treatment: Gait belt Activity Tolerance: Patient tolerated treatment well Patient left: in chair;with call bell/phone within reach;with chair alarm set Nurse Communication: Mobility status  GP     Ferman Hamming 01/19/2013, 11:22 AM Weldon Picking PT Acute Rehab Services [phone] Bee[facility] [phone]

## 2013-01-19 NOTE — Progress Notes (Signed)
Physical Therapy Treatment Patient Details Name: Terri [name] MRN: [id number] DOB: [birth year] Today's Date: 01/19/2013 Time: 7829-5621 PT Time Calculation (min): 23 min  PT Assessment / Plan / Recommendation Comments on Treatment Session  Progressing with mobility this pm.  Still a little "cloudy" cognitively.    Follow Up Recommendations  SNF;Supervision/Assistance - 24 hour;Supervision for mobility/OOB     Does the patient have the potential to tolerate intense rehabilitation     Barriers to Discharge        Equipment Recommendations  Rolling walker with 5" wheels    Recommendations for Other Services    Frequency Min 5X/week   Plan Discharge plan remains appropriate;Frequency remains appropriate    Precautions / Restrictions Precautions Precautions: Back;Fall Precaution Booklet Issued: Yes (comment) Precaution Comments: reviewed back precautions and log rolling technique Required Braces or Orthoses: Spinal Brace Spinal Brace: Lumbar corset Restrictions Weight Bearing Restrictions: No   Pertinent Vitals/Pain Pt. C/o back pain "10/10" with no grimacing .  RN made aware.  Pt not requesting pain med.    Mobility  Bed Mobility Bed Mobility: Sit to Sidelying Left;Rolling Right Rolling Right: 4: Min guard Sit to Sidelying Left: 4: Min assist Details for Bed Mobility Assistance: assist primarily for liftimg legs back into bed Transfers Transfers: Sit to Stand;Stand to Sit Sit to Stand: 4: Min assist;From chair/3-in-1;With armrests Stand to Sit: 4: Min assist;With upper extremity assist;To bed Details for Transfer Assistance: still needs cues for hand placement, managing well with less physciacl assist this pm Ambulation/Gait Ambulation/Gait Assistance: 4: Min assist Ambulation Distance (Feet): 90 Feet Assistive device: Rolling walker Ambulation/Gait Assistance Details: pt. better able to direct RW this pm Gait Pattern: Step-through pattern;Decreased step length -  right;Decreased step length - left;Decreased hip/knee flexion - right;Decreased hip/knee flexion - left Gait velocity: slowed Stairs: No Wheelchair Mobility Wheelchair Mobility: No    Exercises     PT Diagnosis:    PT Problem List:   PT Treatment Interventions:     PT Goals Acute Rehab PT Goals Pt will go Sit to Supine/Side: with modified independence PT Goal: Sit to Supine/Side - Progress: Progressing toward goal Pt will go Sit to Stand: with supervision PT Goal: Sit to Stand - Progress: Progressing toward goal Pt will go Stand to Sit: with supervision PT Goal: Stand to Sit - Progress: Progressing toward goal Pt will Ambulate: 51 - 150 feet;with supervision;with rolling walker PT Goal: Ambulate - Progress: Progressing toward goal Additional Goals Additional Goal #1: Pt. will state and adhere to 3/3 back precautions and log rolling technique PT Goal: Additional Goal #1 - Progress: Progressing toward goal  Visit Information  Last PT Received On: 01/19/13 Assistance Needed: +1    Subjective Data  Subjective: "I walked 3 times today" (pt. actuially walked only twice)   Cognition  Cognition Overall Cognitive Status: Impaired Area of Impairment: Following commands;Safety/judgement;Awareness of deficits;Awareness of errors Arousal/Alertness: Awake/alert Orientation Level: Other (comment) (knew place this pm) Behavior During Session: [facility] for tasks performed Following Commands: Follows one step commands consistently    Balance     End of Session PT - End of Session Equipment Utilized During Treatment: Gait belt Activity Tolerance: Patient tolerated treatment well Patient left: in bed;with call bell/phone within reach;with bed alarm set;Other (comment) (all 4 rails up at pt. request) Nurse Communication: Mobility status   GP     Ferman Hamming 01/19/2013, 3:25 PM Weldon Picking PT Acute Rehab Services [phone] [facility] [phone]

## 2013-01-19 NOTE — Evaluation (Signed)
Occupational Therapy Evaluation Patient Details Name: Terri [name] MRN: [id number] DOB: 1941/10/27 Today's Date: 01/19/2013 Time: 9528-4132 OT Time Calculation (min): 26 min  OT Assessment / Plan / Recommendation Clinical Impression  Pt is recovering from PLIF.  Hospitalization complicated by confusion, UTI, possible ileus.  Pt will benefit from OT to address back precautions and mobility related to ADL.  Pt has limited assistance at home and will need SNF for ST rehab.    OT Assessment  Patient needs continued OT Services    Follow Up Recommendations  SNF    Barriers to Discharge Decreased caregiver support    Equipment Recommendations  None recommended by OT    Recommendations for Other Services    Frequency  Min 2X/week    Precautions / Restrictions Precautions Precautions: Back;Fall Precaution Booklet Issued: Yes (comment) Precaution Comments: back precautions explained to pt. Required Braces or Orthoses: Spinal Brace Spinal Brace: Lumbar corset;Applied in sitting position;Other (comment) Restrictions Weight Bearing Restrictions: No   Pertinent Vitals/Pain 10/10, back, meds provided by RN, repositioned    ADL  Eating/Feeding: Independent Where Assessed - Eating/Feeding: Chair Grooming: Brushing hair;Set up Where Assessed - Grooming: Unsupported sitting Upper Body Bathing: Minimal assistance Where Assessed - Upper Body Bathing: Unsupported sitting Lower Body Bathing: Maximal assistance Where Assessed - Lower Body Bathing: Unsupported sitting;Supported sit to stand Upper Body Dressing: Minimal assistance Where Assessed - Upper Body Dressing: Unsupported sitting Lower Body Dressing: Maximal assistance Where Assessed - Lower Body Dressing: Unsupported sitting;Supported sit to stand Equipment Used: Back brace;Gait belt;Rolling walker Transfers/Ambulation Related to ADLs: mod assist for ambulation with RW, primarily to guide the walker ADL Comments: Pt with some  confusion and generally weak.  Likely to need repetition of education provided today.    OT Diagnosis: Generalized weakness;Acute pain;Cognitive deficits  OT Problem List: Decreased strength;Impaired balance (sitting and/or standing);Decreased knowledge of use of DME or AE;Decreased cognition;Decreased safety awareness;Pain;Decreased activity tolerance OT Treatment Interventions: Self-care/ADL training;DME and/or AE instruction;Patient/family education   OT Goals Acute Rehab OT Goals OT Goal Formulation: With patient Time For Goal Achievement: 02/02/13 Potential to Achieve Goals: Good ADL Goals Pt Will Perform Grooming: with supervision;Standing at sink ADL Goal: Grooming - Progress: Goal set today Pt Will Perform Lower Body Bathing: with supervision;Sit to stand from bed ADL Goal: Lower Body Bathing - Progress: Goal set today Pt Will Perform Lower Body Dressing: with supervision;Sit to stand from bed ADL Goal: Lower Body Dressing - Progress: Goal set today Pt Will Transfer to Toilet: with supervision;Ambulation;Comfort height toilet;Maintaining back safety precautions ADL Goal: Toilet Transfer - Progress: Goal set today Pt Will Perform Toileting - Clothing Manipulation: with supervision;Standing ADL Goal: Toileting - Clothing Manipulation - Progress: Goal set today Pt Will Perform Toileting - Hygiene: with modified independence;Sitting on 3-in-1 or toilet ADL Goal: Toileting - Hygiene - Progress: Goal set today Miscellaneous OT Goals Miscellaneous OT Goal #1: Pt will state 3/3 back precautions. OT Goal: Miscellaneous Goal #1 - Progress: Goal set today  Visit Information  Last OT Received On: 01/19/13 Assistance Needed: +1 PT/OT Co-Evaluation/Treatment: Yes Reason Eval/Treat Not Completed: Fatigue/lethargy limiting ability to participate    Subjective Data  Subjective: "My daughter said I wasn't going home." Patient Stated Goal: Eventual return to PLOF.   Prior  Functioning     Home Living Lives With: Spouse Available Help at Discharge: Available PRN/intermittently Type of Home: House Home Access: Ramped entrance Home Layout: One level Bathroom Shower/Tub: Other (comment) (w/c accessible shower) Bathroom Toilet: Standard Bathroom Accessibility:  Yes How Accessible: Accessible via wheelchair Home Adaptive Equipment: Bedside commode/3-in-1;Shower chair with back Prior Function Level of Independence: Independent Driving: Yes Vocation: Retired Musician: No difficulties Dominant Hand: Right         Vision/Perception Vision - History Patient Visual Report: No change from baseline   Cognition  Cognition Overall Cognitive Status: Impaired Area of Impairment: Following commands;Safety/judgement;Awareness of deficits;Awareness of errors Arousal/Alertness: Awake/alert Orientation Level: Disoriented to;Place;Time;Situation Behavior During Session: [facility] for tasks performed Following Commands: Follows one step commands inconsistently (needs multimodal cues and repetition) Safety/Judgement: Decreased awareness of safety precautions;Decreased awareness of need for assistance Awareness of Errors: Assistance required to identify errors made;Assistance required to correct errors made    Extremity/Trunk Assessment Right Upper Extremity Assessment RUE ROM/Strength/Tone: [facility] for tasks assessed RUE Coordination: WFL - gross/fine motor Left Upper Extremity Assessment LUE ROM/Strength/Tone: WFL for tasks assessed LUE Coordination: WFL - gross/fine motor  Trunk Assessment Trunk Assessment: Normal     Mobility Bed Mobility Bed Mobility: Not assessed (pt seated EOB upon therapist's arrival) Transfers Transfers: Sit to Stand;Stand to Sit Sit to Stand: 3: Mod assist;From bed;With upper extremity assist Stand to Sit: 4: Min assist;With upper extremity assist;To chair/3-in-1 Details for Transfer Assistance: Pt. needed repeated  cueing on correct hand placement and technique/precautions     Exercise     Balance Balance Balance Assessed: Yes Static Sitting Balance Static Sitting - Balance Support: No upper extremity supported;Feet supported Static Sitting - Level of Assistance: 6: Modified independent (Device/Increase time) Static Standing Balance Static Standing - Balance Support: Bilateral upper extremity supported;During functional activity Static Standing - Level of Assistance: 4: Min assist   End of Session OT - End of Session Activity Tolerance: Patient tolerated treatment well Patient left: in chair;with call bell/phone within reach;with chair alarm set Nurse Communication: Mobility status (d/c recommendation of SNF)  GO     Evern Bio 01/19/2013, 11:35 AM [phone]

## 2013-01-19 NOTE — Progress Notes (Signed)
Subjective: Patient reports She's doing okay she has no leg pain her back pain is getting better however she is confused and she has some abdominal pain  Objective: Vital signs in last 24 hours: Temp:  [97.8 F (36.6 C)-99.6 F (37.6 C)] 99.6 F (37.6 C) (02/20 0600) Pulse Rate:  [66-77] 69 (02/20 0600) Resp:  [10-20] 18 (02/20 0600) BP: (111-166)/(37-68) 160/55 mmHg (02/20 0600) SpO2:  [94 %-100 %] 96 % (02/20 0600)  Intake/Output from previous day: 02/19 0701 - 02/20 0700 In: 1088 [P.O.:863; I.V.:225] Out: 3887 [Urine:3887] Intake/Output this shift:    Patient is awake alert strength is 5 out of 5 in her lower extremities her wound is clean and dry abdomen is soft nontender except for deep palpation the right upper quadrant.  Lab Results:  Recent Labs  01/17/13 0450 01/18/13 0839  WBC 7.4 11.0*  HGB 12.5 12.2  HCT 36.2 34.7*  PLT 81* 87*   BMET  Recent Labs  01/17/13 0450 01/18/13 0839  NA 137 128*  K 4.3 4.0  CL 103 95*  CO2 28 25  GLUCOSE 133* 134*  BUN 9 7  CREATININE 0.58 0.52  CALCIUM 7.9* 8.1*    Studies/Results: No results found.  Assessment/Plan: Postop day 3 from plus doing fairly well from her back perspective she does appear to have a mild ileus we will slow down her by mouth intake. Stop Her PCA, mobilize with physical therapy  LOS: 3 days     Terri [name] P 01/19/2013, 7:41 AM

## 2013-01-19 NOTE — Progress Notes (Signed)
PT/OT Cancellation Note  Patient Details Name: Terri [name] MRN: [id number] DOB: 07/25/1941   Cancelled Treatment:    Reason Eval/Treat Not Completed: Fatigue/lethargy limiting ability to participate.  Daughter reports pt sleeping poorly last night.  Requested therapies try back later.  Will continue to follow.  Evern Bio 01/19/2013, 8:32 AM [phone]

## 2013-01-20 LAB — BASIC METABOLIC PANEL
CO2: 30 mEq/L (ref 19–32)
Chloride: 98 mEq/L (ref 96–112)
Creatinine, Ser: 0.64 mg/dL (ref 0.50–1.10)

## 2013-01-20 NOTE — Anesthesia Postprocedure Evaluation (Signed)
  Anesthesia Post-op Note  Patient: Terri [name]  Procedure(s) Performed: Procedure(s): Decompression Lumbar Laminectomy and Fusion at Lumbar Three-Four,Lumbar Four-Five. (Bilateral)  Patient Location: PACU  Anesthesia Type:General  Level of Consciousness: awake, alert  and oriented  Airway and Oxygen Therapy: Patient Spontanous Breathing and Patient connected to nasal cannula oxygen  Post-op Pain: mild  Post-op Assessment: Post-op Vital signs reviewed, Patient's Cardiovascular Status Stable, Respiratory Function Stable, Patent Airway and Adequate PO intake  Post-op Vital Signs: stable  Complications: No apparent anesthesia complications

## 2013-01-20 NOTE — Progress Notes (Signed)
Pt. Refused CPAP at this time.  Pt. And daughter states someone is bringing pt. Home CPAP in tomorrow. Pt. Prefers to wear her machine.  Pt. Encouraged to call Respiratory if CPAP needed.

## 2013-01-20 NOTE — Progress Notes (Signed)
Subjective: Patient reports She still better more awake alert she's got cramping or quads but otherwise no radicular pain back pain is more manageable  Objective: Vital signs in last 24 hours: Temp:  [97.8 F (36.6 C)-99 F (37.2 C)] 98.5 F (36.9 C) (02/21 0600) Pulse Rate:  [66-86] 81 (02/21 0936) Resp:  [17-18] 17 (02/21 0936) BP: (109-139)/(51-65) 134/51 mmHg (02/21 0936) SpO2:  [94 %-100 %] 99 % (02/21 0936)  Intake/Output from previous day: 02/20 0701 - 02/21 0700 In: 960 [P.O.:960] Out: 1750 [Urine:1750] Intake/Output this shift:    Strength is 5 out of 5 wound is clean and dry  Lab Results:  Recent Labs  01/18/13 0839  WBC 11.0*  HGB 12.2  HCT 34.7*  PLT 87*   BMET  Recent Labs  01/18/13 0839  NA 128*  K 4.0  CL 95*  CO2 25  GLUCOSE 134*  BUN 7  CREATININE 0.52  CALCIUM 8.1*    Studies/Results: No results found.  Assessment/Plan: Progressively mobilized with physical outpatient therapy which will consult inpatient rehabilitation  LOS: 4 days     Terri [name] P 01/20/2013, 9:56 AM

## 2013-01-20 NOTE — Progress Notes (Signed)
Pt. Was already on home CPAP with home mask & circuit upon entering room. CPAP was checked by RT for frayed wires. Will call bio-med to check CPAP. Pt. Is tolerating CPAP well at this time without any complications.

## 2013-01-20 NOTE — Progress Notes (Signed)
Foley removed @ 10:10am, pt tolerated well. Will keep monitoring.

## 2013-01-20 NOTE — Progress Notes (Signed)
Physical Therapy Treatment Patient Details Name: Terri [name] MRN: [id number] DOB: [birth year] Today's Date: 01/20/2013 Time: 1191-4782 PT Time Calculation (min): 27 min  PT Assessment / Plan / Recommendation Comments on Treatment Session  Patient s/p L3-5 PLIF.  Continues to improve with mobility and cognition today.  Agree with need for SNF for continued therapy.    Follow Up Recommendations  SNF;Supervision/Assistance - 24 hour     Does the patient have the potential to tolerate intense rehabilitation     Barriers to Discharge        Equipment Recommendations  Rolling walker with 5" wheels    Recommendations for Other Services    Frequency Min 5X/week   Plan Discharge plan remains appropriate;Frequency remains appropriate    Precautions / Restrictions Precautions Precautions: Back;Fall Precaution Comments: Patient able to recall 2/3 back precautions.  Reviewed with patient. Required Braces or Orthoses: Spinal Brace Spinal Brace: Lumbar corset;Applied in sitting position Restrictions Weight Bearing Restrictions: No   Pertinent Vitals/Pain     Mobility  Bed Mobility Bed Mobility: Sit to Sidelying Right Sit to Sidelying Right: 4: Min guard;HOB flat Details for Bed Mobility Assistance: Verbal cues for technique to move to sidelying from sitting.  Patient able to lift LE's onto bed.  Positioned in sidelying with pillow between knees. Transfers Transfers: Sit to Stand;Stand to Sit Sit to Stand: 4: Min assist;With upper extremity assist;With armrests;From chair/3-in-1;From toilet Stand to Sit: 4: Min guard;With upper extremity assist;To toilet;To bed Details for Transfer Assistance: Verbal cues for hand placement, particularly from chair (attempting to pull up using RW).  Assist primarily for balance. Ambulation/Gait Ambulation/Gait Assistance: 4: Min guard Ambulation Distance (Feet): 130 Feet Assistive device: Rolling walker Ambulation/Gait Assistance Details:  Verbal cues to stand upright, with more erect posture.  Patient able to maneuver RW in straight hallway and during turns.  Assist needed to back up to bed with RW. Gait Pattern: Step-through pattern;Decreased stride length;Trunk flexed Gait velocity: slowed      PT Goals Acute Rehab PT Goals PT Goal: Sit to Supine/Side - Progress: Progressing toward goal PT Goal: Sit to Stand - Progress: Progressing toward goal PT Goal: Stand to Sit - Progress: Progressing toward goal PT Transfer Goal: Bed to Chair/Chair to Bed - Progress: Progressing toward goal PT Goal: Ambulate - Progress: Progressing toward goal Additional Goals PT Goal: Additional Goal #1 - Progress: Progressing toward goal  Visit Information  Last PT Received On: 01/20/13 Assistance Needed: +1    Subjective Data  Subjective: "I'm not as confused today"  "I'm hallucinating a little"   Cognition  Cognition Overall Cognitive Status: Impaired Area of Impairment: Safety/judgement;Problem solving Arousal/Alertness: Awake/alert Orientation Level: Appears intact for tasks assessed Behavior During Session: Anxious Safety/Judgement: Decreased safety judgement for tasks assessed Safety/Judgement - Other Comments: Patient walking away from RW x2 during session.  Instructed patient to stay with RW until ready to sit down. Problem Solving: Difficulty "figuring out" how to turn to sit on bed with RW. Cognition - Other Comments: More clear today per daughter.    Balance     End of Session PT - End of Session Equipment Utilized During Treatment: Gait belt;Back brace Activity Tolerance: Patient tolerated treatment well (Pain decreased with ambulation per patient.) Patient left: in bed;with call bell/phone within reach;with family/visitor present Nurse Communication: Mobility status   GP     Vena Austria 01/20/2013, 2:41 PM Durenda Hurt. Renaldo Fiddler, [facility] Acute Rehab Services Pager [phone]

## 2013-01-20 NOTE — Clinical Social Work Note (Signed)
Clinical Social Work   CSW met with pt and daughter to provide bed offers. CSW explained process of discharging pt to facility, including transportation specifics and Medicare guidelines. Pt and daughter will contact this CSW with bed choice. CSW will continue to follow.   Dede Query, MSW, [facility] [phone]

## 2013-01-20 NOTE — Progress Notes (Signed)
Physical medicine and rehabilitation consult requested in chart reviewed. Patient status post PLIF lumbar L3-5. Patient appears to be progressing nicely and currently at minimal assist 90 feet with a rolling walker and should progress well enough to go home and asked couple of days. Will defer formal rehabilitation consult at this time

## 2013-01-21 LAB — URINALYSIS, ROUTINE W REFLEX MICROSCOPIC
Glucose, UA: NEGATIVE mg/dL
Ketones, ur: NEGATIVE mg/dL
pH: 7.5 (ref 5.0–8.0)

## 2013-01-21 MED ORDER — SULFAMETHOXAZOLE-TMP DS 800-160 MG PO TABS
1.0000 | ORAL_TABLET | Freq: Two times a day (BID) | ORAL | Status: DC
  Administered 2013-01-21 – 2013-01-24 (×7): 1 via ORAL
  Filled 2013-01-21 (×8): qty 1

## 2013-01-21 MED ORDER — POTASSIUM CHLORIDE CRYS ER 20 MEQ PO TBCR
40.0000 meq | EXTENDED_RELEASE_TABLET | Freq: Once | ORAL | Status: AC
  Administered 2013-01-21: 40 meq via ORAL
  Filled 2013-01-21: qty 2

## 2013-01-21 MED ORDER — HYDROCODONE-ACETAMINOPHEN 5-325 MG PO TABS
1.0000 | ORAL_TABLET | ORAL | Status: DC | PRN
  Administered 2013-01-21 (×2): 2 via ORAL
  Administered 2013-01-21: 1 via ORAL
  Administered 2013-01-22 – 2013-01-24 (×7): 2 via ORAL
  Filled 2013-01-21 (×6): qty 2
  Filled 2013-01-21: qty 1
  Filled 2013-01-21 (×3): qty 2

## 2013-01-21 MED ORDER — PHENAZOPYRIDINE HCL 100 MG PO TABS
100.0000 mg | ORAL_TABLET | Freq: Three times a day (TID) | ORAL | Status: DC
  Administered 2013-01-21 – 2013-01-24 (×9): 100 mg via ORAL
  Filled 2013-01-21 (×12): qty 1

## 2013-01-21 NOTE — Progress Notes (Signed)
Physical Therapy Treatment Patient Details Name: Terri [name] MRN: [id number] DOB: May 14, 1941 Today's Date: 01/21/2013 Time: 0865-7846 PT Time Calculation (min): 23 min  PT Assessment / Plan / Recommendation Comments on Treatment Session  Pt very pleasant & willing to participate.   Moves fairly well but slightly unsteady ambulation.       Follow Up Recommendations  SNF;Supervision/Assistance - 24 hour     Does the patient have the potential to tolerate intense rehabilitation     Barriers to Discharge        Equipment Recommendations  Rolling walker with 5" wheels    Recommendations for Other Services    Frequency Min 5X/week   Plan Discharge plan remains appropriate;Frequency remains appropriate    Precautions / Restrictions Precautions Precautions: Back;Fall Precaution Comments: Patient able to recall 2/3 back precautions.  Reviewed with patient. Required Braces or Orthoses: Spinal Brace Spinal Brace: Lumbar corset;Applied in sitting position Restrictions Weight Bearing Restrictions: No       Mobility  Bed Mobility Bed Mobility: Rolling Right;Right Sidelying to Sit;Sitting - Scoot to Edge of Bed Rolling Right: 5: Supervision;With rail Right Sidelying to Sit: 5: Supervision;With rails Sitting - Scoot to Edge of Bed: 5: Supervision Details for Bed Mobility Assistance: cues for log rolling technique & back precautions Transfers Transfers: Sit to Stand;Stand to Sit Sit to Stand: 4: Min assist;With upper extremity assist;From bed;From toilet Stand to Sit: 4: Min guard;With upper extremity assist;With armrests;To chair/3-in-1;To toilet Details for Transfer Assistance: (A) for stability with standing due to pt unsteady.   Cues for hand placement, body positioning before sitting, & technique.   Ambulation/Gait Ambulation/Gait Assistance: 4: Min guard;4: Min Environmental consultant (Feet): 300 Feet Assistive device: Rolling walker Ambulation/Gait Assistance  Details: Min (A) for stability with initial steps due to pt unsteady also unsteadiness noted when pt stops & then starts again.   Gait Pattern: Step-through pattern;Decreased stride length;Decreased step length - right;Decreased step length - left;Narrow base of support (decreased step height) Stairs: No Wheelchair Mobility Wheelchair Mobility: No     PT Goals Acute Rehab PT Goals Time For Goal Achievement: 01/26/13 Potential to Achieve Goals: Good Pt will Roll Supine to Right Side: with modified independence PT Goal: Rolling Supine to Right Side - Progress: Progressing toward goal Pt will Roll Supine to Left Side: with modified independence Pt will go Supine/Side to Sit: with modified independence PT Goal: Supine/Side to Sit - Progress: Progressing toward goal Pt will go Sit to Supine/Side: with modified independence Pt will go Sit to Stand: with supervision PT Goal: Sit to Stand - Progress: Not met Pt will go Stand to Sit: with supervision PT Goal: Stand to Sit - Progress: Progressing toward goal Pt will Transfer Bed to Chair/Chair to Bed: with supervision Pt will Ambulate: 51 - 150 feet;with supervision;with rolling walker PT Goal: Ambulate - Progress: Progressing toward goal Additional Goals Additional Goal #1: Pt. will state and adhere to 3/3 back precautions and log rolling technique PT Goal: Additional Goal #1 - Progress: Progressing toward goal  Visit Information  Last PT Received On: 01/21/13 Assistance Needed: +1    Subjective Data      Cognition  Cognition Overall Cognitive Status: Impaired Area of Impairment: Safety/judgement;Awareness of errors;Awareness of deficits Arousal/Alertness: Awake/alert Orientation Level: Appears intact for tasks assessed Behavior During Session: [facility] for tasks performed Safety/Judgement: Decreased safety judgement for tasks assessed Awareness of Errors: Assistance required to identify errors made;Assistance required to correct errors  made    Balance  End of Session PT - End of Session Equipment Utilized During Treatment: Gait belt;Back brace Activity Tolerance: Patient tolerated treatment well Patient left: in chair;with call bell/phone within reach;with family/visitor present Nurse Communication: Mobility status     Verdell Face, [place] [phone] 01/21/2013

## 2013-01-21 NOTE — Progress Notes (Signed)
Pt placews self on/off her home CPAP.

## 2013-01-21 NOTE — Progress Notes (Signed)
Patient daughter complaining of patient hallucinating.  She feels pain medicine is to blame.  Dr. Lovell Sheehan dc'd percocet and entered order for vicoden.  Will continue to monitor.  Lance Bosch, RN

## 2013-01-21 NOTE — Progress Notes (Signed)
UA results back-many bacteria seen.  Paged Dr. Lovell Sheehan for orders.  Will continue to monitor.  Terri [name]

## 2013-01-21 NOTE — Progress Notes (Signed)
Subjective: Patient reports She's feeling okay she has good days and bad days good hours and that hours she did ambulate little bit yesterday she still denies any leg pain she's having some chills consistent low-grade fever and she's having some issues imaging or bladder completely. On exam s  Objective: Vital signs in last 24 hours: Temp:  [97.8 F (36.6 C)-100 F (37.8 C)] 98.2 F (36.8 C) (02/22 0517) Pulse Rate:  [66-83] 66 (02/22 0517) Resp:  [16-20] 20 (02/22 0517) BP: (124-170)/(43-76) 170/76 mmHg (02/22 0517) SpO2:  [95 %-100 %] 100 % (02/22 0517)  Intake/Output from previous day:   Intake/Output this shift:    On exam she has 5 out of 5 strength in her lower 70s her wound is clean and dry  Lab Results:  Recent Labs  01/18/13 0839  WBC 11.0*  HGB 12.2  HCT 34.7*  PLT 87*   BMET  Recent Labs  01/18/13 0839 01/20/13 0928  NA 128* 136  K 4.0 3.2*  CL 95* 98  CO2 25 30  GLUCOSE 134* 141*  BUN 7 13  CREATININE 0.52 0.64  CALCIUM 8.1* 8.5    Studies/Results: No results found.  Assessment/Plan: Incentive spirometer every hour progressive mobilization with physical outpatient therapy continue to work on bowel movements  LOS: 5 days     Flower Franko P 01/21/2013, 8:33 AM

## 2013-01-21 NOTE — Progress Notes (Signed)
Patient ID: Terri [name], female   DOB: 25-Mar-1941, 72 y.o.   MRN: [id number] The patient's urinalysis has come back consistent with urinary tract infection. The cultures are pending. I will start her on empiric Bactrim for the presumed infection and await cultures. We will start peridium for her bladder spasms.

## 2013-01-22 NOTE — Progress Notes (Signed)
Patient ID: Terri CISSELL, female   DOB: October 07, 1941, 72 y.o.   MRN: [id number] Subjective:  The patient is alert and pleasant. She feels much better. She has some confusion and dysuria yesterday. She would like to shower.  Objective: Vital signs in last 24 hours: Temp:  [97.8 F (36.6 C)-99.9 F (37.7 C)] 97.8 F (36.6 C) (02/23 1042) Pulse Rate:  [66-81] 74 (02/23 1042) Resp:  [16-20] 18 (02/23 1042) BP: (104-148)/(43-65) 115/65 mmHg (02/23 1042) SpO2:  [98 %-100 %] 100 % (02/23 1042)  Intake/Output from previous day:   Intake/Output this shift: Total I/O In: 240 [P.O.:240] Out: -   Physical exam the patient is alert and pleasant. She looks well. She is moving her lower extremities well.  Lab Results: No results found for this basename: WBC, HGB, HCT, PLT,  in the last 72 hours BMET  Recent Labs  01/20/13 0928  NA 136  K 3.2*  CL 98  CO2 30  GLUCOSE 141*  BUN 13  CREATININE 0.64  CALCIUM 8.5    Studies/Results: No results found.  Assessment/Plan: Postop day #6: The patient is awaiting skilled nursing facility placement.  LOS: 6 days     Terri [name] D 01/22/2013, 10:52 AM

## 2013-01-22 NOTE — Progress Notes (Signed)
Pt seen,found asleep with her home cpap on and tolerating well at this time.  Pt resting well, no distress noted.  Pt did awaken while at bedside.  Pt was advised that RT available all night should she need any assistance with cpap.  RT offered to fill humidity chamber with more sterile water, but pt refused at this time.

## 2013-01-23 LAB — URINE CULTURE: Colony Count: >=100000

## 2013-01-23 MED ORDER — HYDROCODONE-ACETAMINOPHEN 5-325 MG PO TABS: 1.0000 | ORAL_TABLET | ORAL | Status: DC | PRN

## 2013-01-23 MED ORDER — CYCLOBENZAPRINE HCL 10 MG PO TABS: 10.0000 mg | ORAL_TABLET | Freq: Three times a day (TID) | ORAL | Status: DC | PRN

## 2013-01-23 MED ORDER — SULFAMETHOXAZOLE-TMP DS 800-160 MG PO TABS: 1.0000 | ORAL_TABLET | Freq: Two times a day (BID) | ORAL | Status: DC

## 2013-01-23 NOTE — Clinical Social Work Note (Addendum)
Clinical Social Work   CSW confirmed bed with [facility]. Facility will be able to accept today. CSW will continue to follow to facilitate discharge to [facility].   Dede Query, MSW, [facility] [phone]  Addendum: CSW will facilitate discharge to [facility] in the morning. Pt and family are agreeable to discharge plan. MD and facility are aware.  Dede Query, MSW, [facility]

## 2013-01-23 NOTE — Progress Notes (Signed)
Physical Therapy Treatment Patient Details Name: Terri [name] MRN: [id number] DOB: 05-05-1941 Today's Date: 01/23/2013 Time: 6213-0865 PT Time Calculation (min): 13 min  PT Assessment / Plan / Recommendation Comments on Treatment Session  Pt progressing very well however remains to benefit from SNF due to pt primary caregiver to spouse at home. Pt currenlty unsafe to amb without RW and is unable to lift.    Follow Up Recommendations  SNF;Supervision/Assistance - 24 hour     Does the patient have the potential to tolerate intense rehabilitation     Barriers to Discharge        Equipment Recommendations  Rolling walker with 5" wheels    Recommendations for Other Services    Frequency Min 5X/week   Plan Discharge plan remains appropriate;Frequency remains appropriate    Precautions / Restrictions Precautions Precautions: Back;Fall Precaution Booklet Issued: Yes (comment) Precaution Comments: Patient able to recall 2/3 back precautions.  Reviewed with patient. Required Braces or Orthoses: Spinal Brace Spinal Brace: Lumbar corset;Applied in sitting position Restrictions Weight Bearing Restrictions: No   Pertinent Vitals/Pain 8/10 surgical back pain     Mobility  Bed Mobility Bed Mobility: Not assessed Transfers Transfers: Sit to Stand;Stand to Sit Sit to Stand: 5: Supervision;With upper extremity assist;From bed;From chair/3-in-1 Stand to Sit: 5: Supervision;With upper extremity assist;To chair/3-in-1 Details for Transfer Assistance: increased time, v/c's for hand placement Ambulation/Gait Ambulation/Gait Assistance: 4: Min guard Ambulation Distance (Feet): 150 Feet Assistive device: Rolling walker Ambulation/Gait Assistance Details: attempted amb without AD however pt very unsteady, pt requires RW for safe amb Gait velocity: improving General Gait Details: antalgic initially due to R LE soreness but then improved t/o amb Stairs: Yes Stairs Assistance: 4: Min  assist Stair Management Technique: One rail Right;Step to pattern Number of Stairs: 6    Exercises     PT Diagnosis:    PT Problem List:   PT Treatment Interventions:     PT Goals Acute Rehab PT Goals PT Goal: Sit to Stand - Progress: Progressing toward goal PT Goal: Stand to Sit - Progress: Progressing toward goal PT Transfer Goal: Bed to Chair/Chair to Bed - Progress: Progressing toward goal PT Goal: Ambulate - Progress: Progressing toward goal Additional Goals PT Goal: Additional Goal #1 - Progress: Progressing toward goal  Visit Information  Last PT Received On: 01/23/13 Assistance Needed: +1    Subjective Data  Subjective: Pt received sitting EOB agreeable to PT. Pt c/o 8/10 back and R LEpain Patient Stated Goal: rehab   Cognition  Cognition Overall Cognitive Status: Appears within functional limits for tasks assessed/performed Arousal/Alertness: Awake/alert Orientation Level: Oriented X4 / Intact Behavior During Session: [facility] for tasks performed Following Commands: Follows one step commands consistently Cognition - Other Comments: con't v/c's to limit twisting    Balance     End of Session PT - End of Session Equipment Utilized During Treatment: Gait belt;Back brace Activity Tolerance: Patient tolerated treatment well Patient left: in chair;with call bell/phone within reach;with family/visitor present Nurse Communication: Mobility status   GP     Marcene Brawn 01/23/2013, 3:38 PM  Lewis Shock, PT, DPT Pager #: [phone] Office #: [phone]

## 2013-01-23 NOTE — Discharge Summary (Signed)
Physician Discharge Summary  Patient ID: Terri [name] MRN: [id number] DOB/AGE: [birth year] 72 y.o.  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 *   Discharged Condition: good  Hospital Course: This is been the hospital underwent decompression stabilization procedure at L4-5 and L5-S1. Intraoperative the patient had a dural tear and had a significant amount of blood loss or she was kept in the ICU flat in her back overnight and then slowly and progressively mobilize the next couple days. Patient was very slow to mobilize secondary to severe pain but she eventually was able mobilize and pain was under much better control progressively throughout the hospitalization. Postop and hematocrits were all stable in the mid-30s on the day prior to discharge patient was complaining of some dysuria she was initiated on Bactrim so by times its transfer to the nursing home she'll be on day 2 her back of her neck to be continued for probably about 8 more days. She'll be discharged person, Vicodin and cyclobenzaprine for pain at the time of discharge she is ambulating and voiding spontaneously she still struggling with a bowel movement but she didn't really want take any suppositories or enemas prior to transfer. Patient should followup with me in approximately 1-2 weeks in the office she is to keep the incision clean and dry.  Consults: Significant Diagnostic Studies: Treatments: Decompression stabilization procedure L4-5 L5-S1 Discharge Exam: Blood pressure 107/59, pulse 71, temperature 99.6 F (37.6 C), temperature source Oral, resp. rate 20, height 5\' 4"  (1.626 m), weight 69.4 kg (153 lb), SpO2 98.00%. Strength out of 5 wound clean and dry  Disposition: Skilled nursing facility     Medication List     STOP taking these medications       naproxen sodium 220 MG tablet  Commonly known as:  ANAPROX      TAKE these medications       amoxicillin 875 MG tablet  Commonly known as:  AMOXIL  Take 875 mg by mouth 2 (two) times daily.     CALCIUM 600 + D PO  Take 1 tablet by mouth daily.     citalopram 20 MG tablet  Commonly known as:  CELEXA  Take 20 mg by mouth daily.     cyclobenzaprine 10 MG tablet  Commonly known as:  FLEXERIL  Take 1 tablet (10 mg total) by mouth 3 (three) times daily as needed for muscle spasms.     HYDROcodone-acetaminophen 5-325 MG per tablet  Commonly known as:  NORCO/VICODIN  Take 1-2 tablets by mouth every 4 (four) hours as needed.     mometasone 50 MCG/ACT nasal spray  Commonly known as:  NASONEX  Place 2 sprays into the nose daily as needed. For allergies     nitrofurantoin (macrocrystal-monohydrate) 100 MG capsule  Commonly known as:  MACROBID  Take 100 mg by mouth 2 (two) times daily. To finish course on 01/19/13. Per patient for sore throat.     OVER THE COUNTER MEDICATION  Take 1 tablet by mouth daily as needed. Store brand allergy medication   For allergies     simvastatin 40 MG tablet  Commonly known as:  ZOCOR  Take 40 mg by mouth every evening.     sulfamethoxazole-trimethoprim 800-160 MG per tablet  Commonly known as:  BACTRIM DS  Take 1 tablet by mouth every 12 (  twelve) hours.           Follow-up Information   Follow up with Kiyoto Slomski P, MD.   Contact information:   [address] [place] [address] [phone]       Signed: Achillies Buehl P 01/23/2013, 5:19 PM

## 2013-01-23 NOTE — Progress Notes (Signed)
Patient ID: Terri [name], female   DOB: 10-09-1941, 72 y.o.   MRN: [id number]  [place] Citizen is doing great with significantly improved leg pain back pain is manageable now she still not had a bowel movement she but she doesn't really want try suppository or and them anymore she doesn't continue walker as he can get moving. Neurologically she is 5 of 5 strength wound is clean and dry work on placement tomorrow

## 2013-01-23 NOTE — Progress Notes (Signed)
Pt places self on/off her home CPAP.  RT filled home CPAP with distilled water for pt, and let her know if she needs any further assistance to let her RN know to call RT.

## 2013-01-24 NOTE — Care Management Note (Signed)
    Page 1 of 1   01/24/2013     1:10:57 PM   CARE MANAGEMENT NOTE 01/24/2013  Patient:  Terri [name], Terri [name]   Account Number:  [id number]  Date Initiated:  01/20/2013  Documentation initiated by:  [facility]  Subjective/Objective Assessment:   admitted postop PLIF     Action/Plan:   PT/OT evals recommending SNF   Anticipated DC Date:  01/23/2013   Anticipated DC Plan:  SKILLED NURSING FACILITY  In-house referral  Clinical Social Worker      DC Planning Services  CM consult      Choice offered to / List presented to:             Status of service:  Completed, signed off Medicare Important Message given?   (If response is "NO", the following Medicare IM given date fields will be blank) Date Medicare IM given:   Date Additional Medicare IM given:    Discharge Disposition:  SKILLED NURSING FACILITY  Per UR Regulation:  Reviewed for med. necessity/level of care/duration of stay  If discussed at Long Length of Stay Meetings, dates discussed:    Comments:

## 2013-01-24 NOTE — Clinical Social Work Note (Signed)
Clinical Social Work   Pt is ready for discharge to [facility]. Facility has received discharge summary and is ready to accept pt. Pt and family are agreeable to discharge plan. Pt's pastor will provide transportation to facility. CSW is signing off as no further needs identified.   Dede Query, MSW, Theresia Majors [phone]

## 2013-01-24 NOTE — Progress Notes (Signed)
Pt d/c to the SNF [facility], called and give report to [place], Charity fundraiser.  Gave all d/c instructions to the patient and she got her ride to the SNF by their pastor, which is arranged by her children. No concerns took the pt down in a wheel chair.  Danne Harbor 01/24/13

## 2013-01-24 NOTE — Progress Notes (Signed)
Patient ID: Terri [name], female   DOB: Aug 27, 1941, 72 y.o.   MRN: [id number] Patient nausea and vomiting last night. Her neurologic exam stable the wound is clean and dry discharge [facility]

## 2013-06-05 ENCOUNTER — Other Ambulatory Visit: Payer: Self-pay | Admitting: Adult Health

## 2014-03-29 IMAGING — CR DG CHEST 2V
2 series · 2 of 2 positions shown · non-contrast
Comparison: Two-view chest 01/19/2011.

CLINICAL DATA: Preop for lumbar spine surgery.  Recent cough.

CHEST - 2 VIEW

[view not recorded (1 of 2)]
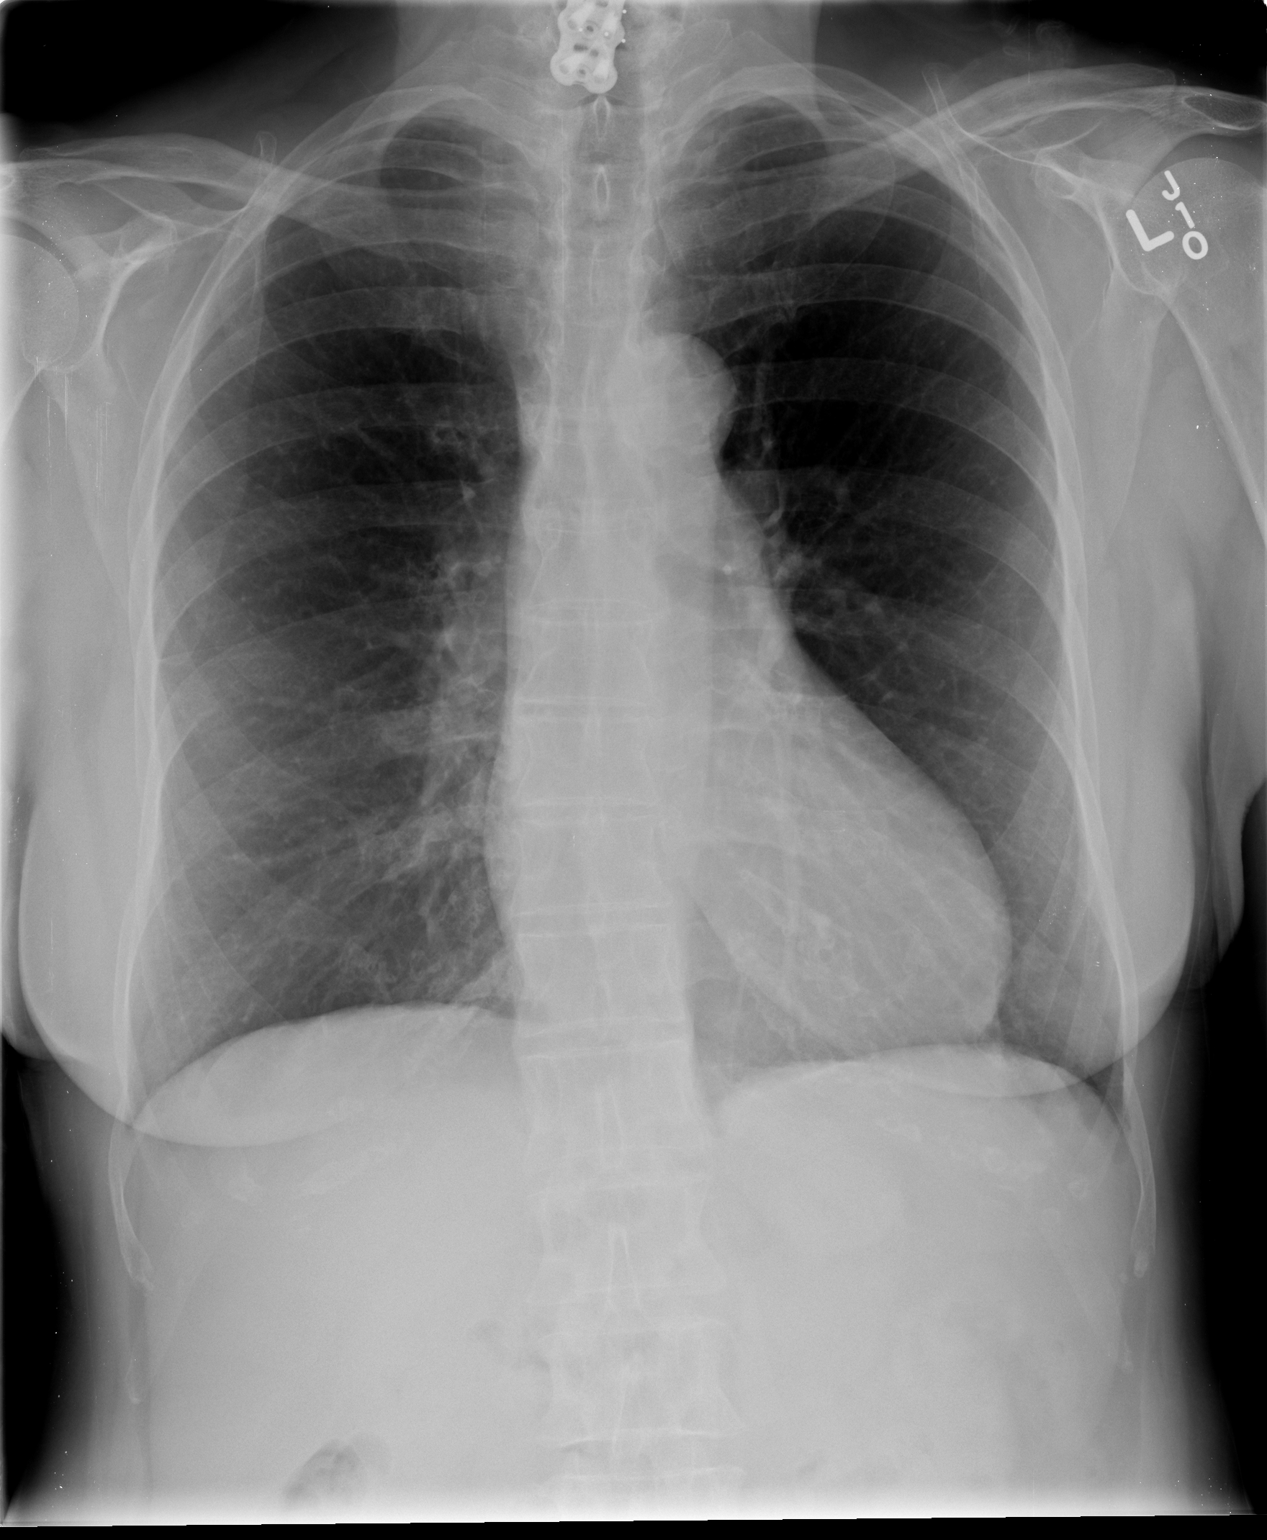

[view not recorded (2 of 2)]
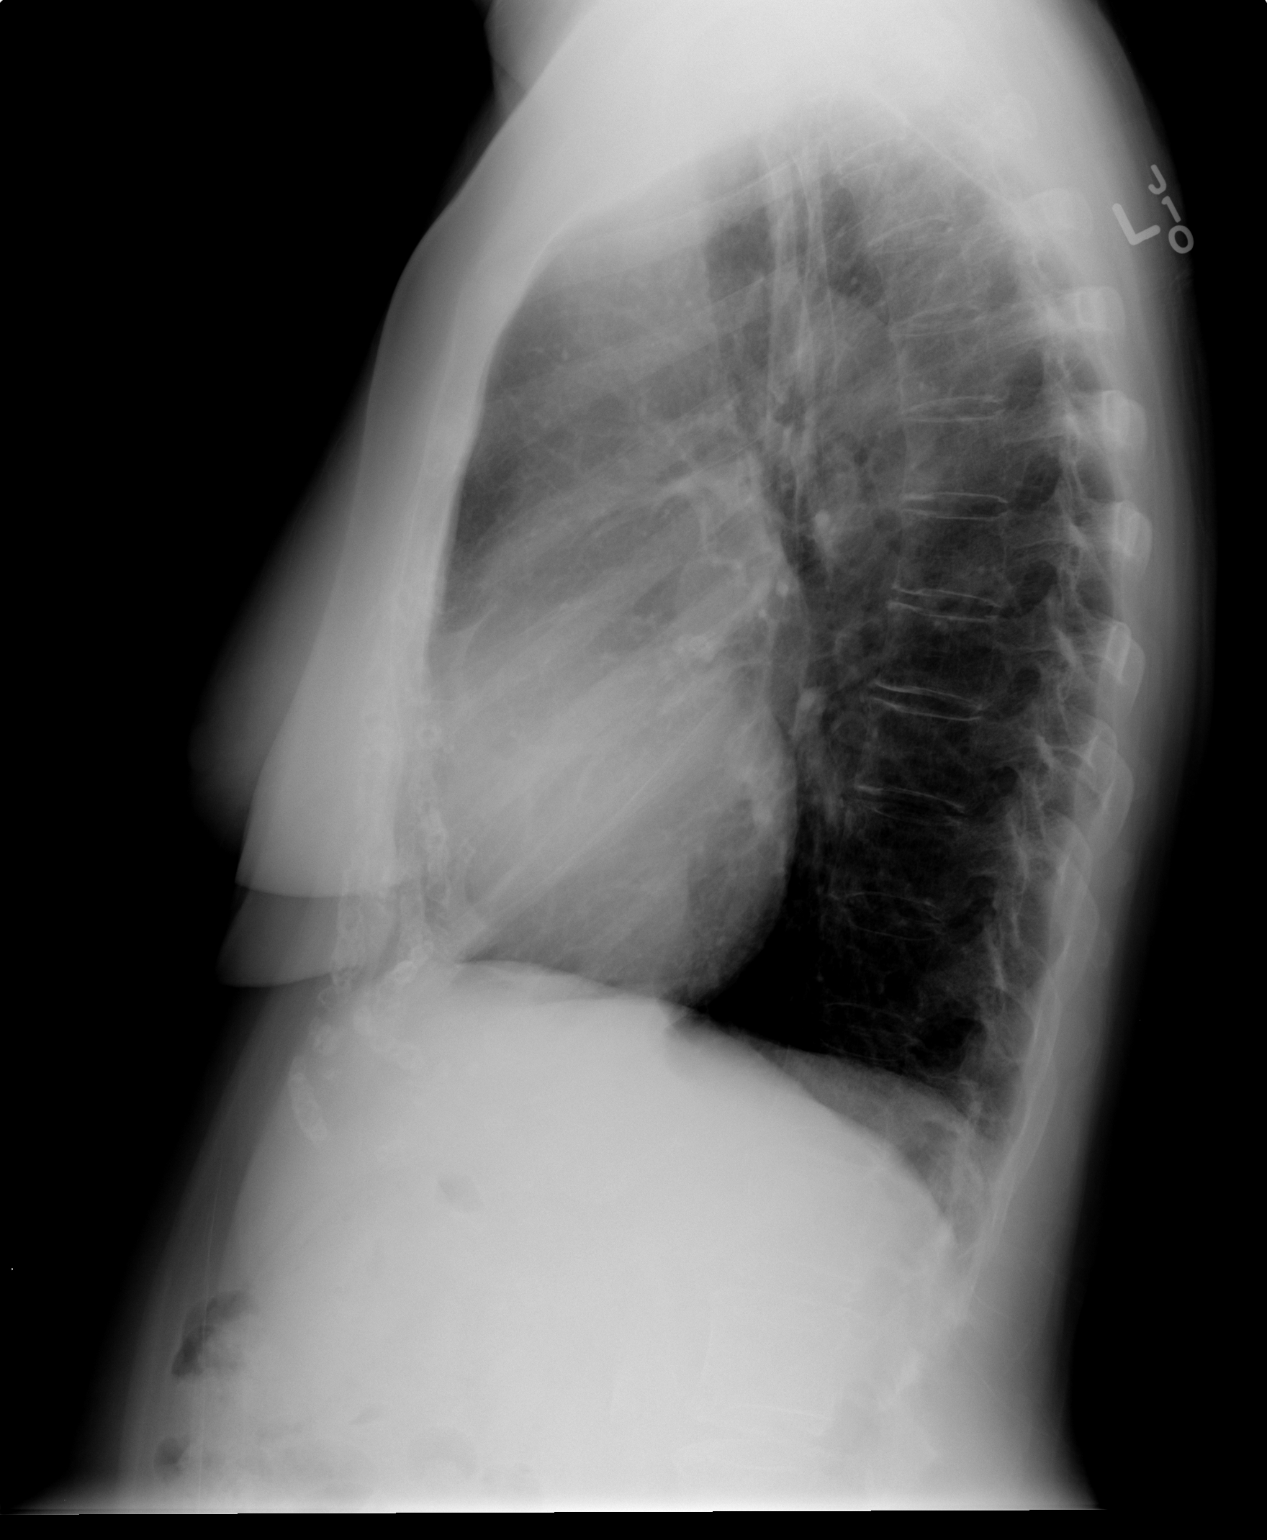

[2 of 2 positions shown; findings below may reference images not displayed]

FINDINGS: The heart size is normal.  The lungs are clear.
Emphysematous changes are again noted.  The visualized soft tissues
and bony thorax are unremarkable. The patient is status post
cervical spine surgery in the interval.
IMPRESSION: 1.  No acute cardiopulmonary disease or significant interval
change.
2.  Emphysema.

## 2014-04-05 IMAGING — RF DG LUMBAR SPINE 2-3V
1 series · 2 of 2 positions shown · non-contrast
Comparison: None.

CLINICAL DATA: L3-5 fusion.

DG C-ARM GT 120 MIN, LUMBAR SPINE - 2-3 VIEW
TECHNIQUE: Two fluoroscopic spot views of the lower lumbar spine
are provided.

[Series 1: run · 2 of 2 slices shown]
[im 1/2]
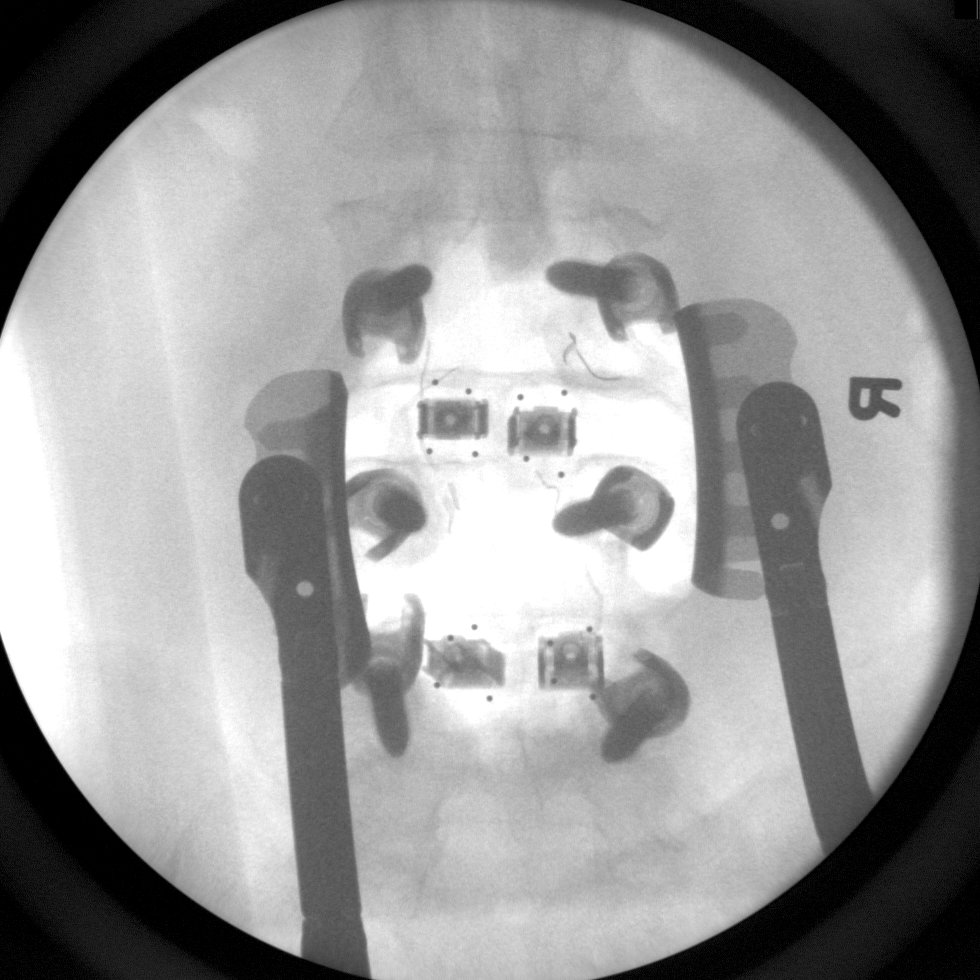
[im 2/2]
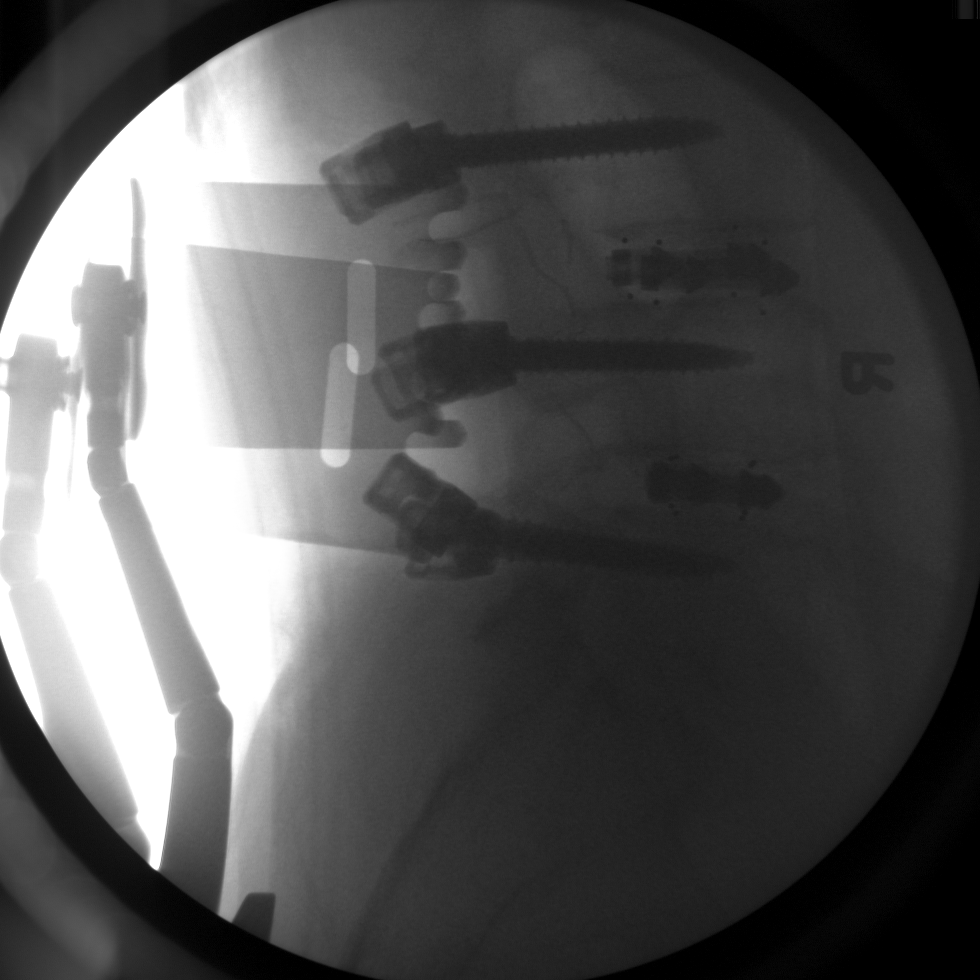

[2 of 2 positions shown; findings below may reference images not displayed]

FINDINGS: Images demonstrate pedicle screws with interbody spacers
from L3-L5.
IMPRESSION: L3-5 PLIF in progress.

## 2014-07-29 DIAGNOSIS — I872 Venous insufficiency (chronic) (peripheral): Secondary | ICD-10-CM | POA: Insufficient documentation

## 2014-07-29 DIAGNOSIS — I739 Peripheral vascular disease, unspecified: Secondary | ICD-10-CM | POA: Insufficient documentation

## 2014-07-29 DIAGNOSIS — Z961 Presence of intraocular lens: Secondary | ICD-10-CM | POA: Insufficient documentation

## 2014-07-29 DIAGNOSIS — R001 Bradycardia, unspecified: Secondary | ICD-10-CM | POA: Insufficient documentation

## 2014-07-29 DIAGNOSIS — J209 Acute bronchitis, unspecified: Secondary | ICD-10-CM | POA: Insufficient documentation

## 2014-07-29 DIAGNOSIS — M546 Pain in thoracic spine: Secondary | ICD-10-CM | POA: Insufficient documentation

## 2014-07-29 DIAGNOSIS — K2970 Gastritis, unspecified, without bleeding: Secondary | ICD-10-CM | POA: Insufficient documentation

## 2014-07-29 DIAGNOSIS — B009 Herpesviral infection, unspecified: Secondary | ICD-10-CM | POA: Insufficient documentation

## 2014-07-29 DIAGNOSIS — L719 Rosacea, unspecified: Secondary | ICD-10-CM | POA: Insufficient documentation

## 2014-07-29 DIAGNOSIS — M47816 Spondylosis without myelopathy or radiculopathy, lumbar region: Secondary | ICD-10-CM | POA: Insufficient documentation

## 2014-07-29 DIAGNOSIS — F329 Major depressive disorder, single episode, unspecified: Secondary | ICD-10-CM | POA: Insufficient documentation

## 2014-07-29 DIAGNOSIS — H8109 Meniere's disease, unspecified ear: Secondary | ICD-10-CM | POA: Insufficient documentation

## 2014-07-29 DIAGNOSIS — E785 Hyperlipidemia, unspecified: Secondary | ICD-10-CM | POA: Insufficient documentation

## 2014-07-29 DIAGNOSIS — R413 Other amnesia: Secondary | ICD-10-CM | POA: Insufficient documentation

## 2014-07-29 DIAGNOSIS — N952 Postmenopausal atrophic vaginitis: Secondary | ICD-10-CM | POA: Insufficient documentation

## 2014-07-29 DIAGNOSIS — M5136 Other intervertebral disc degeneration, lumbar region: Secondary | ICD-10-CM | POA: Insufficient documentation

## 2014-07-29 DIAGNOSIS — J309 Allergic rhinitis, unspecified: Secondary | ICD-10-CM | POA: Insufficient documentation

## 2014-07-29 DIAGNOSIS — M79604 Pain in right leg: Secondary | ICD-10-CM | POA: Insufficient documentation

## 2014-07-29 DIAGNOSIS — IMO0002 Reserved for concepts with insufficient information to code with codable children: Secondary | ICD-10-CM | POA: Insufficient documentation

## 2014-07-29 DIAGNOSIS — M7730 Calcaneal spur, unspecified foot: Secondary | ICD-10-CM | POA: Insufficient documentation

## 2014-07-29 DIAGNOSIS — M8580 Other specified disorders of bone density and structure, unspecified site: Secondary | ICD-10-CM | POA: Insufficient documentation

## 2014-07-29 DIAGNOSIS — M1990 Unspecified osteoarthritis, unspecified site: Secondary | ICD-10-CM | POA: Insufficient documentation

## 2014-07-29 DIAGNOSIS — M629 Disorder of muscle, unspecified: Secondary | ICD-10-CM | POA: Insufficient documentation

## 2014-07-29 DIAGNOSIS — S99929A Unspecified injury of unspecified foot, initial encounter: Secondary | ICD-10-CM | POA: Insufficient documentation

## 2014-07-29 DIAGNOSIS — N200 Calculus of kidney: Secondary | ICD-10-CM | POA: Insufficient documentation

## 2014-07-29 DIAGNOSIS — H2510 Age-related nuclear cataract, unspecified eye: Secondary | ICD-10-CM | POA: Insufficient documentation

## 2014-07-29 DIAGNOSIS — M47812 Spondylosis without myelopathy or radiculopathy, cervical region: Secondary | ICD-10-CM | POA: Insufficient documentation

## 2014-07-29 DIAGNOSIS — N390 Urinary tract infection, site not specified: Secondary | ICD-10-CM | POA: Insufficient documentation

## 2014-07-29 DIAGNOSIS — B001 Herpesviral vesicular dermatitis: Secondary | ICD-10-CM | POA: Insufficient documentation

## 2014-07-29 DIAGNOSIS — M25579 Pain in unspecified ankle and joints of unspecified foot: Secondary | ICD-10-CM | POA: Insufficient documentation

## 2014-07-29 DIAGNOSIS — M25551 Pain in right hip: Secondary | ICD-10-CM | POA: Insufficient documentation

## 2014-07-29 DIAGNOSIS — R002 Palpitations: Secondary | ICD-10-CM | POA: Insufficient documentation

## 2014-07-29 DIAGNOSIS — M169 Osteoarthritis of hip, unspecified: Secondary | ICD-10-CM | POA: Insufficient documentation

## 2014-07-29 DIAGNOSIS — E162 Hypoglycemia, unspecified: Secondary | ICD-10-CM | POA: Insufficient documentation

## 2014-07-29 DIAGNOSIS — K219 Gastro-esophageal reflux disease without esophagitis: Secondary | ICD-10-CM | POA: Insufficient documentation

## 2014-07-29 DIAGNOSIS — M2420 Disorder of ligament, unspecified site: Secondary | ICD-10-CM | POA: Insufficient documentation

## 2014-07-29 DIAGNOSIS — G4733 Obstructive sleep apnea (adult) (pediatric): Secondary | ICD-10-CM | POA: Insufficient documentation

## 2014-07-29 DIAGNOSIS — M4326 Fusion of spine, lumbar region: Secondary | ICD-10-CM | POA: Insufficient documentation

## 2014-07-29 DIAGNOSIS — M48061 Spinal stenosis, lumbar region without neurogenic claudication: Secondary | ICD-10-CM | POA: Insufficient documentation

## 2014-07-29 DIAGNOSIS — M7060 Trochanteric bursitis, unspecified hip: Secondary | ICD-10-CM | POA: Insufficient documentation

## 2014-07-29 DIAGNOSIS — H35349 Macular cyst, hole, or pseudohole, unspecified eye: Secondary | ICD-10-CM | POA: Insufficient documentation

## 2014-07-29 DIAGNOSIS — J040 Acute laryngitis: Secondary | ICD-10-CM | POA: Insufficient documentation

## 2014-07-29 DIAGNOSIS — L821 Other seborrheic keratosis: Secondary | ICD-10-CM | POA: Insufficient documentation

## 2014-07-29 DIAGNOSIS — M79673 Pain in unspecified foot: Secondary | ICD-10-CM | POA: Insufficient documentation

## 2014-07-29 DIAGNOSIS — J0100 Acute maxillary sinusitis, unspecified: Secondary | ICD-10-CM | POA: Insufficient documentation

## 2014-07-29 DIAGNOSIS — M5430 Sciatica, unspecified side: Secondary | ICD-10-CM | POA: Insufficient documentation

## 2014-07-29 DIAGNOSIS — K589 Irritable bowel syndrome without diarrhea: Secondary | ICD-10-CM | POA: Insufficient documentation

## 2014-07-29 DIAGNOSIS — I8390 Asymptomatic varicose veins of unspecified lower extremity: Secondary | ICD-10-CM | POA: Insufficient documentation

## 2014-07-29 DIAGNOSIS — IMO0001 Reserved for inherently not codable concepts without codable children: Secondary | ICD-10-CM | POA: Insufficient documentation

## 2014-07-29 DIAGNOSIS — M419 Scoliosis, unspecified: Secondary | ICD-10-CM | POA: Insufficient documentation

## 2014-07-29 DIAGNOSIS — R300 Dysuria: Secondary | ICD-10-CM | POA: Insufficient documentation

## 2014-07-29 DIAGNOSIS — R030 Elevated blood-pressure reading, without diagnosis of hypertension: Secondary | ICD-10-CM | POA: Insufficient documentation

## 2014-07-29 DIAGNOSIS — F32A Depression, unspecified: Secondary | ICD-10-CM | POA: Insufficient documentation

## 2014-07-29 DIAGNOSIS — N3941 Urge incontinence: Secondary | ICD-10-CM | POA: Insufficient documentation

## 2014-07-29 DIAGNOSIS — G4752 REM sleep behavior disorder: Secondary | ICD-10-CM | POA: Insufficient documentation

## 2014-08-11 DIAGNOSIS — Z961 Presence of intraocular lens: Secondary | ICD-10-CM | POA: Insufficient documentation

## 2014-08-11 DIAGNOSIS — R4789 Other speech disturbances: Secondary | ICD-10-CM | POA: Insufficient documentation

## 2015-10-18 ENCOUNTER — Other Ambulatory Visit: Payer: Self-pay | Admitting: Neurosurgery

## 2015-10-18 DIAGNOSIS — M5137 Other intervertebral disc degeneration, lumbosacral region: Secondary | ICD-10-CM

## 2015-11-04 ENCOUNTER — Ambulatory Visit
Admission: RE | Admit: 2015-11-04 | Discharge: 2015-11-04 | Disposition: A | Discharge status: Home / Self Care | Payer: Medicare HMO | Source: Ambulatory Visit | Attending: Neurosurgery | Admitting: Neurosurgery

## 2015-11-04 ENCOUNTER — Other Ambulatory Visit: Payer: Self-pay

## 2015-11-04 VITALS — BP 167/79 | HR 64

## 2015-11-04 DIAGNOSIS — M5137 Other intervertebral disc degeneration, lumbosacral region: Secondary | ICD-10-CM

## 2015-11-04 DIAGNOSIS — M48061 Spinal stenosis, lumbar region without neurogenic claudication: Secondary | ICD-10-CM

## 2015-11-04 MED ORDER — IOHEXOL 180 MG/ML  SOLN
1.0000 mL | Freq: Once | INTRAMUSCULAR | Status: AC | PRN
  Administered 2015-11-04: 1 mL via EPIDURAL

## 2015-11-04 MED ORDER — DIAZEPAM 5 MG PO TABS
5.0000 mg | ORAL_TABLET | Freq: Once | ORAL | Status: AC
  Administered 2015-11-04: 5 mg via ORAL

## 2015-11-04 MED ORDER — METHYLPREDNISOLONE ACETATE 40 MG/ML INJ SUSP (RADIOLOG: 120.0000 mg | Freq: Once | INTRAMUSCULAR | Status: DC

## 2015-11-04 NOTE — Progress Notes (Signed)
Patient states she has been off Celexa and Tramadol for at least the past two days.  jkl

## 2015-11-04 NOTE — Discharge Instructions (Addendum)
Myelogram Discharge Instructions  1. Go home and rest quietly for the next 24 hours.  It is important to lie flat for the next 24 hours.  Get up only to go to the restroom.  You may lie in the bed or on a couch on your back, your stomach, your left side or your right side.  You may have one pillow under your head.  You may have pillows between your knees while you are on your side or under your knees while you are on your back.  2. DO NOT drive today.  Recline the seat as far back as it will go, while still wearing your seat belt, on the way home.  3. You may get up to go to the bathroom as needed.  You may sit up for 10 minutes to eat.  You may resume your normal diet and medications unless otherwise indicated.  Drink lots of extra fluids today and tomorrow.  4. The incidence of headache, nausea, or vomiting is about 5% (one in 20 patients).  If you develop a headache, lie flat and drink plenty of fluids until the headache goes away.  Caffeinated beverages may be helpful.  If you develop severe nausea and vomiting or a headache that does not go away with flat bed rest, call [phone].  5. You may resume normal activities after your 24 hours of bed rest is over; however, do not exert yourself strongly or do any heavy lifting tomorrow. If when you get up you have a headache when standing, go back to bed and force fluids for another 24 hours.  6. Call your physician for a follow-up appointment.  The results of your myelogram will be sent directly to your physician by the following day.  7. If you have any questions or if complications develop after you arrive home, please call [phone].  Discharge instructions have been explained to the patient.  The patient, or the person responsible for the patient, fully understands these instructions.       You may resume Celexa and Tramadol on Tuesday, November 05, 2015, after 1:00 pm.

## 2015-12-03 ENCOUNTER — Other Ambulatory Visit: Payer: Self-pay | Admitting: Neurosurgery

## 2015-12-03 DIAGNOSIS — D329 Benign neoplasm of meninges, unspecified: Secondary | ICD-10-CM

## 2015-12-11 ENCOUNTER — Ambulatory Visit
Admission: RE | Admit: 2015-12-11 | Discharge: 2015-12-11 | Disposition: A | Discharge status: Home / Self Care | Payer: Medicare HMO | Source: Ambulatory Visit | Attending: Neurosurgery | Admitting: Neurosurgery

## 2015-12-11 DIAGNOSIS — D329 Benign neoplasm of meninges, unspecified: Secondary | ICD-10-CM

## 2015-12-11 MED ORDER — GADOBENATE DIMEGLUMINE 529 MG/ML IV SOLN
14.0000 mL | Freq: Once | INTRAVENOUS | Status: AC | PRN
  Administered 2015-12-11: 14 mL via INTRAVENOUS

## 2020-06-13 LAB — BASIC METABOLIC PANEL
BUN: 18 (ref 4–21)
BUN: 18 (ref 4–21)
CO2: 26 — AB (ref 13–22)
CO2: 26 — AB (ref 13–22)
Chloride: 101 (ref 99–108)
Chloride: 101 (ref 99–108)
Creatinine: 0.9 (ref 0.5–1.1)
Creatinine: 0.9 (ref 0.5–1.1)
Glucose: 109
Glucose: 109
Potassium: 3.8 (ref 3.4–5.3)
Potassium: 3.8 (ref 3.4–5.3)
Sodium: 137 (ref 137–147)
Sodium: 137 (ref 137–147)

## 2020-06-13 LAB — CBC AND DIFFERENTIAL
HCT: 37 (ref 36–46)
Hemoglobin: 12.5 (ref 12.0–16.0)
Platelets: 176 (ref 150–399)
WBC: 5.8

## 2020-06-13 LAB — CBC: RBC: 3.79 — AB (ref 3.87–5.11)

## 2020-06-13 LAB — HEPATIC FUNCTION PANEL
ALT: 11 (ref 7–35)
AST: 19 (ref 13–35)

## 2020-06-13 LAB — COMPREHENSIVE METABOLIC PANEL
Albumin: 4.3 (ref 3.5–5.0)
Albumin: 4.3 (ref 3.5–5.0)
Calcium: 9.1 (ref 8.7–10.7)
Calcium: 9.1 (ref 8.7–10.7)

## 2020-06-27 LAB — BASIC METABOLIC PANEL
BUN: 19 (ref 4–21)
CO2: 22 (ref 13–22)
Chloride: 99 (ref 99–108)
Creatinine: 0.8 (ref 0.5–1.1)
Glucose: 100
Potassium: 3.6 (ref 3.4–5.3)
Sodium: 138 (ref 137–147)

## 2020-06-27 LAB — CBC AND DIFFERENTIAL
HCT: 35 — AB (ref 36–46)
Hemoglobin: 11.8 — AB (ref 12.0–16.0)
Platelets: 140 — AB (ref 150–399)
WBC: 5.3

## 2020-06-27 LAB — CBC: RBC: 3.52 — AB (ref 3.87–5.11)

## 2020-06-27 LAB — COMPREHENSIVE METABOLIC PANEL: Calcium: 9.4 (ref 8.7–10.7)

## 2020-06-28 ENCOUNTER — Encounter: Payer: Self-pay | Admitting: Internal Medicine

## 2020-06-28 ENCOUNTER — Non-Acute Institutional Stay (SKILLED_NURSING_FACILITY): Payer: Medicare HMO | Admitting: Internal Medicine

## 2020-06-28 ENCOUNTER — Telehealth: Payer: Self-pay | Admitting: Family

## 2020-06-28 DIAGNOSIS — N3941 Urge incontinence: Secondary | ICD-10-CM

## 2020-06-28 DIAGNOSIS — E785 Hyperlipidemia, unspecified: Secondary | ICD-10-CM

## 2020-06-28 DIAGNOSIS — Z9181 History of falling: Secondary | ICD-10-CM

## 2020-06-28 DIAGNOSIS — B029 Zoster without complications: Secondary | ICD-10-CM

## 2020-06-28 DIAGNOSIS — R1012 Left upper quadrant pain: Secondary | ICD-10-CM

## 2020-06-28 DIAGNOSIS — F324 Major depressive disorder, single episode, in partial remission: Secondary | ICD-10-CM

## 2020-06-28 DIAGNOSIS — G903 Multi-system degeneration of the autonomic nervous system: Secondary | ICD-10-CM

## 2020-06-28 DIAGNOSIS — B020 Zoster encephalitis: Secondary | ICD-10-CM | POA: Diagnosis not present

## 2020-06-28 DIAGNOSIS — M47816 Spondylosis without myelopathy or radiculopathy, lumbar region: Secondary | ICD-10-CM

## 2020-06-28 DIAGNOSIS — G3183 Dementia with Lewy bodies: Secondary | ICD-10-CM

## 2020-06-28 DIAGNOSIS — F0281 Dementia in other diseases classified elsewhere with behavioral disturbance: Secondary | ICD-10-CM

## 2020-06-28 DIAGNOSIS — R531 Weakness: Secondary | ICD-10-CM

## 2020-06-28 DIAGNOSIS — M810 Age-related osteoporosis without current pathological fracture: Secondary | ICD-10-CM

## 2020-06-28 NOTE — Progress Notes (Signed)
Provider:  Rexene Edison. Mariea Clonts, D.O., C.M.D. Location:  [facility] Room Number: [name]:  SNF (31)  PCP: Terri Benton, MD Patient Care Team: Terri Benton, MD as PCP - General (Internal Medicine) Roel Cluck, MD as Referring Physician (Ophthalmology) Sofie Rower, [place] (Internal Medicine)  Extended Emergency Contact Information Primary Emergency Contact: Petersen,James P Address: [place], [place] [address] [place] Phone: [phone] Relation: Spouse Secondary Emergency Contact: Earlie Raveling [place] Phone: [phone] Mobile Phone: [phone] Relation: Daughter  Code Status: FULL CODE Goals of Care: Advanced Directive information Advanced Directives 07/04/2020  Does Patient Have a Medical Advance Directive? Yes  Type of Advance Directive Living will  Does patient want to make changes to medical advance directive? No - Patient declined  Copy of [place] in Chart? -  Pre-existing out of facility DNR order (yellow form or pink MOST form) -    Chief Complaint  Patient presents with  . New Admit To SNF    New admit to SNF     HPI: Patient is a 79 y.o. female with h/o Lewy Body dementia with behavioral disturbance, meningioma, Meniere's disease, depression, anxiety, hypertensive retinopathy, DDD of lumbar spine, hip, IBS, OSA, osteoporosis, PVD, urge incontinence among others seen today for admission to [facility] s/p hospitalization at [facility] from 7/25-7/27 with syncopal episode.  This was determined to be due to orthostatic hypotension.  During the event, she'd had generalized shaking and mild confusion when witnessed by her daughter who caught her preventing any injuries.  Amlodipine 5mg  had been started for HTN recently.  She was felt to probably have some neurogenic component though due to her LBD.  EKG was  stable, echo was reassuring, and she was monitored on telemetry w/o arrhythmias noted.  She did have symptomatic improvement with use of thigh-high compression stockings and a fluid challenge.  Mild permissive htn is to be allowed.  She continues to have orthostatic hypotension symptoms, and abdominal binder is recommended.  Obviously amlodipine was discontinued.  She had increased confusion beginning 7/18 and was found to have shingles L5/S1 distribution 7/19.  She'd presented with left sided abdominal pain.  CT abd/pelvis did not explain symptoms.  She was started on valacylclovir 1g tid.  Due to the ongoing encephalopathy, there were concerns for meningitis or encephalitis so she was switched over to IV acyclovir 10 mg/kg every 8 hours, ID was consulted and the lumbar puncture recommended.  She was sedated for the procedure and interventional radiology performed.  PCR and CSF was positive for VZV.  She continued IV acyclovir at the hospital and ID recommended transitioning to valacyclovir 2 g p.o. every 6 hours thru 8/3.  BMP was to be done 7/29.  Gapapentin being avoided due to LBD.    Generalized weakness and unwitnessed fall occurred 7/22.  There were concerns of a possible compression fracture given the tenderness in her thoracic exam.  Thoracic and lumbar x-rays were obtained and negative.  She did have chronic multilevel spondylosis especially L2-L3 and multilevel degenerative disc disease.  She has had of spinal fusion L3-5.  PT had recommended low intensity postacute rehab.  Imaging results were reviewed in care everywhere and paper discharge packet.  Past Medical History:  Diagnosis Date  . Arthritis   . Bladder disorder    "has bladder stem  dilated approx. every 6 months"  . Bronchitis    hx of  . Cancer ([place])    skin ca lesions removed  . Complication of anesthesia    "states had coughing, chest pain, difficulty swallowing after going home, saw by [facility] doctor, issue resolved with  medicine"  . Cough    hx of recent cough, cold, sinus drainage, on antibiotics at this time"  . Depression    on medication x 2 years  . Diverticulosis    also history of colon polyps  . GERD (gastroesophageal reflux disease)    "not on any medication at this time"  . Heart murmur    "mild heart murmur, sees Dr. Mike Gip"  . Hyperlipidemia    hx of  . Hypertension    hx of, "not on medication at this time" followed by Dr. Alvy Bimler"  . Pneumonia    "as a child"  . PONV (postoperative nausea and vomiting)   . Seasonal allergies    hx of  . Sleep apnea    uses cpap machine, last sleep study over 5 years ago  . Spinal stenosis    hx of  . UTI (urinary tract infection) 01/13/13   Pt diagnosed by family doctor; prescribed antibiotic   Past Surgical History:  Procedure Laterality Date  . BREAST SURGERY     breast biopsy done  . CARDIOVASCULAR STRESS TEST     hx of  . CARPAL TUNNEL RELEASE     hx of  . EYE SURGERY     bilateral cataract surgery, hx of macular hole repair  . GANGLION CYST EXCISION     top of right foot  . LIPOMA EXCISION     hx of  . SUPERFICIAL LYMPH NODE BIOPSY / EXCISION      Social History   Socioeconomic History  . Marital status: Married    Spouse name: Not on file  . Number of children: Not on file  . Years of education: Not on file  . Highest education level: Not on file  Occupational History  . Not on file  Tobacco Use  . Smoking status: Never Smoker  . Smokeless tobacco: Never Used  Substance and Sexual Activity  . Alcohol use: Never  . Drug use: No  . Sexual activity: Not Currently  Other Topics Concern  . Not on file  Social History Narrative  . Not on file   Social Determinants of Health   Financial Resource Strain:   . Difficulty of Paying Living Expenses:   Food Insecurity:   . Worried About Charity fundraiser in the Last Year:   . Arboriculturist in the Last Year:   Transportation Needs:   . Lexicographer (Medical):   Marland Kitchen Lack of Transportation (Non-Medical):   Physical Activity:   . Days of Exercise per Week:   . Minutes of Exercise per Session:   Stress:   . Feeling of Stress :   Social Connections:   . Frequency of Communication with Friends and Family:   . Frequency of Social Gatherings with Friends and Family:   . Attends Religious Services:   . Active Member of Clubs or Organizations:   . Attends Archivist Meetings:   Marland Kitchen Marital Status:     reports that she has never smoked. She has never used smokeless tobacco. She reports that she does not drink alcohol and does not use drugs.  Functional Status Survey:    History  reviewed. No pertinent family history.  Health Maintenance  Topic Date Due  . Hepatitis C Screening  Never done  . TETANUS/TDAP  Never done  . DEXA SCAN  Never done  . PNA vac Low Risk Adult (1 of 2 - PCV13) Never done  . COVID-19 Vaccine (2 - Inadvertent mRNA 2-dose series) 03/28/2020  . INFLUENZA VACCINE  06/30/2020    Allergies  Allergen Reactions  . Darvon [Propoxyphene Hcl] Nausea And Vomiting  . Dicyclomine Hcl Other (See Comments)    Dizziness  . Inderal [Propranolol] Nausea And Vomiting  . Piroxicam   . Ciprofloxacin Rash  . Erythromycin Rash    Outpatient Encounter Medications as of 06/28/2020  Medication Sig  . acetaminophen (TYLENOL) 500 MG tablet Take 500 mg by mouth every 6 (six) hours as needed.  . Calcium Carb-Cholecalciferol (CALCIUM 500 + D3) 500-200 MG-UNIT TABS Take by mouth.  . denosumab (PROLIA) 60 MG/ML SOSY injection Inject 60 mg into the skin every 6 (six) months.  . escitalopram (LEXAPRO) 10 MG tablet Take 10 mg by mouth daily.  . Melatonin 10 MG TABS Take 10 mg by mouth at bedtime.  . memantine (NAMENDA) 5 MG tablet Take 5 mg by mouth 2 (two) times daily.  . pantoprazole (PROTONIX) 20 MG tablet Take 20 mg by mouth 2 (two) times daily. Every morning and every night  . rivastigmine (EXELON) 3 MG capsule  Take 3 mg by mouth 2 (two) times daily.  . simethicone (MYLICON) 712 MG chewable tablet Chew 125 mg by mouth every 6 (six) hours as needed for flatulence.  . simvastatin (ZOCOR) 40 MG tablet Take 40 mg by mouth every evening.  . vitamin B-12 (CYANOCOBALAMIN) 1000 MCG tablet Take 1,000 mcg by mouth daily.  . [DISCONTINUED] citalopram (CELEXA) 20 MG tablet Take 20 mg by mouth daily.  . [DISCONTINUED] Multiple Vitamins-Iron (MULTIVITAMINS WITH IRON) TABS tablet Take 1 tablet by mouth daily.  . [DISCONTINUED] omeprazole (PRILOSEC) 20 MG capsule Take 20 mg by mouth daily.  . [DISCONTINUED] valACYclovir (VALTREX) 500 MG tablet Take 500 mg by mouth 2 (two) times daily.  . [DISCONTINUED] Calcium Carbonate-Vitamin D (CALCIUM 600 + D PO) Take 1 tablet by mouth daily.   No facility-administered encounter medications on file as of 06/28/2020.    Review of Systems  Constitutional: Negative for chills, fever and malaise/fatigue.  HENT: Negative for congestion.   Eyes: Negative for blurred vision.  Respiratory: Negative for cough and shortness of breath.   Cardiovascular: Negative for chest pain, palpitations and leg swelling.  Gastrointestinal: Negative for abdominal pain and constipation.  Genitourinary: Negative for dysuria.  Musculoskeletal: Positive for falls. Negative for joint pain.  Skin: Positive for itching and rash.       Pain, burning  Neurological: Positive for weakness. Negative for dizziness and loss of consciousness.       Did not report dizziness when she sat on edge of bed  Psychiatric/Behavioral: Positive for depression, hallucinations and memory loss. Negative for suicidal ideas. The patient is not nervous/anxious and does not have insomnia.     Vitals:   06/28/20 1143  BP: (!) 111/64  Pulse: 83  Temp: (!) 97.3 F (36.3 C)  SpO2: 98%  Weight: 153 lb (69.4 kg)  Height: 5\' 4"  (1.626 m)   Body mass index is 26.26 kg/m. Physical Exam Vitals reviewed.  Constitutional:       General: She is not in acute distress.    Appearance: Normal appearance. She is not toxic-appearing.  HENT:     Head: Normocephalic and atraumatic.     Right Ear: External ear normal.     Left Ear: External ear normal.     Mouth/Throat:     Pharynx: Oropharynx is clear.  Eyes:     Extraocular Movements: Extraocular movements intact.     Pupils: Pupils are equal, round, and reactive to light.  Cardiovascular:     Rate and Rhythm: Normal rate and regular rhythm.     Heart sounds: Murmur heard.   Pulmonary:     Effort: Pulmonary effort is normal.     Breath sounds: Normal breath sounds. No wheezing, rhonchi or rales.  Abdominal:     General: Bowel sounds are normal.     Palpations: Abdomen is soft. There is no mass.     Tenderness: There is abdominal tenderness. There is no guarding or rebound.     Hernia: No hernia is present.     Comments: LUQ tenderness  Musculoskeletal:        General: Normal range of motion.     Cervical back: Neck supple.     Right lower leg: No edema.     Left lower leg: No edema.  Lymphadenopathy:     Cervical: No cervical adenopathy.  Skin:    General: Skin is warm and dry.     Comments: Scarring and dried up vesicles present left lumbosacral region with evidence of pruritis (fall along top of adult undergarment)  Neurological:     Mental Status: She is alert.     Labs reviewed: additional labs reviewed in discharge packet but could not be abstracted  Basic Metabolic Panel: Recent Labs    06/13/20 0000 06/27/20 0000  NA 137  137 138  K 3.8  3.8 3.6  CL 101  101 99  CO2 26*  26* 22  BUN 18  18 19   CREATININE 0.9  0.9 0.8  CALCIUM 9.1  9.1 9.4   Liver Function Tests: Recent Labs    06/13/20 0000  AST 19  ALT 11  ALBUMIN 4.3  4.3   No results for input(s): LIPASE, AMYLASE in the last 8760 hours. No results for input(s): AMMONIA in the last 8760 hours. CBC: Recent Labs    06/13/20 0000 06/27/20 0000  WBC 5.8 5.3  HGB  12.5 11.8*  HCT 37 35*  PLT 176 140*   Cardiac Enzymes: No results for input(s): CKTOTAL, CKMB, CKMBINDEX, TROPONINI in the last 8760 hours. BNP: Invalid input(s): POCBNP No results found for: HGBA1C No results found for: TSH No results found for: VITAMINB12 No results found for: FOLATE No results found for: IRON, TIBC, FERRITIN  Imaging and Procedures obtained prior to SNF admission: MR Brain W Wo Contrast  Result Date: 12/12/2015 CLINICAL DATA:  Continued surveillance of a meningioma discovered in 2014. Patient complains of confusion, hearing loss, and difficulty walking. EXAM: MRI HEAD WITHOUT AND WITH CONTRAST TECHNIQUE: Multiplanar, multiecho pulse sequences of the brain and surrounding structures were obtained without and with intravenous contrast. CONTRAST:  52mL MULTIHANCE GADOBENATE DIMEGLUMINE 529 MG/ML IV SOLN COMPARISON:  MRI brain 02/17/2013 performed at [facility] imaging. FINDINGS: The patient was unable to remain motionless for the exam. Small or subtle lesions could be overlooked. No evidence for acute stroke, acute hemorrhage, hydrocephalus, or extra-axial fluid. Generalized atrophy. Mild subcortical and periventricular T2 and FLAIR hyperintensities, likely chronic microvascular ischemic change. Flow voids are maintained. Dolichoectatic basilar is supplied primarily from the RIGHT vertebral. Partial empty sella. Slight flattening of  the gland with increased CSF within the sella. No expansion. Post infusion, redemonstrated is a RIGHT posterior frontal convexity meningioma, 9 x 9 x 6 mm, stable from priors. No significant mass effect on the underlying brain. No vasogenic edema. Paucity of enhancement likely reflects intratumoral calcification. No sinus or mastoid disease.  BILATERAL cataract extraction. IMPRESSION: Stable roughly 1 cm sized LEFT posterior frontal convexity meningioma. No mass effect on the underlying brain. Atrophy and small vessel disease, stable. Partial empty  sella. No acute intracranial findings. Electronically Signed   By: Staci Righter M.D.   On: 12/12/2015 09:15    Assessment/Plan 1. Herpes zoster encephalitis -seems to now be alert and interactive, though hospital report already indicated oriented to self -complete course of vanc as planned thru 8/3 and monitor  2. Lewy body dementia with behavioral disturbance ([place]) -is on namenda 5mg  po bid and exelon 3mg  po bid -records indicate her dementia may be mixed etiology  3. Neurogenic orthostatic hypotension ([facility]) -was taken off her amlodipine that was just started -cont with compression stockings, adequate hydration, slow positional changes and reminders to do so, and add abdominal binder if symptoms continue -of note, she's also struggled with meniere's per her records  4. Herpes zoster infection of lumbar region -completing valacylovir now -will add lidoderm patch today for the pain since gabapentin is not an option  5. LUQ abdominal pain -felt to be due to L5/S1 dermatomal distribution shingles so will tx pain by treating shingles pain as in #4  6. H/O fall -at hospital (sounds like slid down side of bed and was found seated)--no compression fxs identified -if not progressing with therapy due to ongoing pain, may need CT of thoracolumbar spine b/c initial xrays could have missed a new compression fx when done just after fall  7. Generalized weakness -here for PT, OT  8. Spondylosis of lumbar region without myelopathy or radiculopathy -chronic along with DDD so has some baseline discomfort -cont tylenol  9. Urge incontinence of urine -noted in history, cont adult undergarments with good pericare  10.  Osteoporosis:  Continue outpatient prolia q 6 mos, ca with D  11. Depression:  Cont lexapro therapy and melatonin to help with rest each night as taken at home  12.  Hyperlipidemia:  Cont zocor each evening at this point though if there is a desire to decrease pill burden, I  would stop this first  Family/ staff Communication: left message for her daughter end of day Tuesday (she'd left before I got to see her mother)  Labs/tests ordered:  Cbc, bmp  Terri [name], D.O. [facility] [address], [place] [address] Cell Phone (Mon-Fri 8am-5pm):  [phone] On Call:  [phone] & follow prompts after 5pm & weekends Office Phone:  [phone] Office Fax:  [phone]

## 2020-06-28 NOTE — Telephone Encounter (Signed)
Patient's daughter [name] called 87: 15 pm  returning Dr. Cyndi Lennert message states her Mother was seen by Dr.Reed at [facility].She had some question to ask MD would like discuss with MD.Notifed patient's daughter that message will be send to MD then she will call her. Verbalized understanding left Number to call [phone].

## 2020-07-04 ENCOUNTER — Non-Acute Institutional Stay (SKILLED_NURSING_FACILITY): Payer: Medicare HMO | Admitting: Family

## 2020-07-04 ENCOUNTER — Encounter: Payer: Self-pay | Admitting: Family

## 2020-07-04 DIAGNOSIS — I951 Orthostatic hypotension: Secondary | ICD-10-CM | POA: Diagnosis not present

## 2020-07-04 NOTE — Progress Notes (Signed)
Location:    [place].   Nursing Home Room Number: 270-J Place of Service:  SNF (31) Provider:  Avinash Maltos.Damarian Priola.NP   Patient Care Team: System, Provider Not In as PCP - General  Extended Emergency Contact Information Primary Emergency Contact: Blankenbaker,James P Address: [place], [place] [address] [place] Phone: [phone] Relation: Spouse Secondary Emergency Contact: Haggerty,Ellen  [place] Phone: [phone] Mobile Phone: [phone] Relation: Daughter  Code Status:  Full Code  Goals of care: Advanced Directive information Advanced Directives 07/04/2020  Does Patient Have a Medical Advance Directive? Yes  Type of Advance Directive Living will  Does patient want to make changes to medical advance directive? No - Patient declined  Copy of [place] in Chart? -  Pre-existing out of facility DNR order (yellow form or pink MOST form) -     Chief Complaint  Patient presents with   Acute Visit    Diziness and Low Blood Pressure with Standing.    HPI:  Pt is a 79 y.o. female seen today for an acute visit for evaluation of dizziness and low blood pressure.Her orthostatic B/p monitor over 24 hrs  Lying 145/84   Sitting: 118/74 standing 74/43   148/84  148/84    148/84  144/82  116/68    89/50  She complains of dizziness every time she stands with assistance. She denies any headache,palpitation,chest pain or shortness of breath.Nurse report patient wa slower down to the floor 07/01/2020 while she was ambulating with staff she complained of legs being weak. No injuries sustained.    Past Medical History:  Diagnosis Date   Arthritis    Bladder disorder    "has bladder stem dilated approx. every 6 months"   Bronchitis    hx of   Cancer ([place])    skin ca lesions removed   Complication of anesthesia    "states had coughing, chest pain, difficulty swallowing after going home,  saw by [facility] doctor, issue resolved with medicine"   Cough    hx of recent cough, cold, sinus drainage, on antibiotics at this time"   Depression    on medication x 2 years   Diverticulosis    also history of colon polyps   GERD (gastroesophageal reflux disease)    "not on any medication at this time"   Heart murmur    "mild heart murmur, sees Dr. Mike Gip"   Hyperlipidemia    hx of   Hypertension    hx of, "not on medication at this time" followed by Dr. Alvy Bimler"   Pneumonia    "as a child"   PONV (postoperative nausea and vomiting)    Seasonal allergies    hx of   Sleep apnea    uses cpap machine, last sleep study over 5 years ago   Spinal stenosis    hx of   UTI (urinary tract infection) 01/13/13   Pt diagnosed by family doctor; prescribed antibiotic   Past Surgical History:  Procedure Laterality Date   BREAST SURGERY     breast biopsy done   CARDIOVASCULAR STRESS TEST     hx of   CARPAL TUNNEL RELEASE     hx of   EYE SURGERY     bilateral cataract surgery, hx of macular hole repair   GANGLION CYST EXCISION     top of right foot   LIPOMA EXCISION  hx of   SUPERFICIAL LYMPH NODE BIOPSY / EXCISION      Allergies  Allergen Reactions   Darvon [Propoxyphene Hcl] Nausea And Vomiting   Dicyclomine Hcl Other (See Comments)    Dizziness   Inderal [Propranolol] Nausea And Vomiting   Piroxicam    Ciprofloxacin Rash   Erythromycin Rash    Allergies as of 07/04/2020      Reactions   Darvon [propoxyphene Hcl] Nausea And Vomiting   Dicyclomine Hcl Other (See Comments)   Dizziness   Inderal [propranolol] Nausea And Vomiting   Piroxicam    Ciprofloxacin Rash   Erythromycin Rash      Medication List       Accurate as of July 04, 2020  4:38 PM. If you have any questions, ask your nurse or doctor.        STOP taking these medications   multivitamins with iron Tabs tablet Stopped by: Nelda Bucks Rieley Khalsa, NP   omeprazole 20  MG capsule Commonly known as: PRILOSEC Stopped by: Sandrea Hughs, NP   valACYclovir 500 MG tablet Commonly known as: VALTREX Stopped by: Sandrea Hughs, NP     TAKE these medications   Calcium 500 + D3 500-200 MG-UNIT Tabs Generic drug: Calcium Carb-Cholecalciferol Take by mouth.   denosumab 60 MG/ML Sosy injection Commonly known as: PROLIA Inject 60 mg into the skin every 6 (six) months.   escitalopram 10 MG tablet Commonly known as: LEXAPRO Take 10 mg by mouth daily.   FIBER PO Take 1 tablet by mouth daily.   lidocaine 5 % Commonly known as: LIDODERM Place 1 patch onto the skin daily. Apply patch to lower back (where shingles was ) on in AM for Shingles pain/post- herpetic neuralgia.   Melatonin 10 MG Tabs Take 10 mg by mouth at bedtime.   memantine 5 MG tablet Commonly known as: NAMENDA Take 5 mg by mouth 2 (two) times daily.   pantoprazole 20 MG tablet Commonly known as: PROTONIX Take 20 mg by mouth 2 (two) times daily. Every morning and every night   rivastigmine 3 MG capsule Commonly known as: EXELON Take 3 mg by mouth 2 (two) times daily.   simethicone 125 MG chewable tablet Commonly known as: MYLICON Chew 510 mg by mouth every 6 (six) hours as needed for flatulence.   simvastatin 40 MG tablet Commonly known as: ZOCOR Take 40 mg by mouth every evening.   TYLENOL 500 MG tablet Generic drug: acetaminophen Take 500 mg by mouth every 6 (six) hours as needed.   vitamin B-12 1000 MCG tablet Commonly known as: CYANOCOBALAMIN Take 1,000 mcg by mouth daily.       Review of Systems  Constitutional: Negative for appetite change, chills, fatigue and fever.  HENT: Negative for congestion, rhinorrhea, sinus pressure, sinus pain, sneezing and sore throat.   Respiratory: Negative for cough, chest tightness, shortness of breath and wheezing.   Cardiovascular: Negative for chest pain, palpitations and leg swelling.  Gastrointestinal: Negative for abdominal  distention, abdominal pain, constipation, diarrhea, nausea and vomiting.  Genitourinary: Negative for dysuria, flank pain, frequency and urgency.  Musculoskeletal: Positive for gait problem. Negative for arthralgias, joint swelling and myalgias.  Skin: Negative for color change, pallor and rash.  Neurological: Negative for speech difficulty, weakness, numbness and headaches.       Dizziness with standing   Hematological: Does not bruise/bleed easily.  Psychiatric/Behavioral: Negative for agitation and sleep disturbance. The patient is not nervous/anxious.     Immunization History  Administered Date(s) Administered   Influenza-Unspecified 10/01/2019   Unspecified SARS-COV-2 Vaccination 02/29/2020   Pertinent  Health Maintenance Due  Topic Date Due   DEXA SCAN  Never done   PNA vac Low Risk Adult (1 of 2 - PCV13) Never done   INFLUENZA VACCINE  06/30/2020   No flowsheet data found.   Vitals:   07/04/20 1625  BP: 118/73  Pulse: 90  Resp: 18  Temp: 97.7 F (36.5 C)  SpO2: 98%  Weight: 153 lb (69.4 kg)  Height: 5\' 4"  (1.626 m)   Body mass index is 26.26 kg/m. Physical Exam Vitals and nursing note reviewed.  Constitutional:      General: She is not in acute distress.    Appearance: She is overweight.  HENT:     Head: Normocephalic.     Nose: Nose normal. No congestion or rhinorrhea.     Mouth/Throat:     Mouth: Mucous membranes are moist.     Pharynx: Oropharynx is clear. No oropharyngeal exudate or posterior oropharyngeal erythema.  Eyes:     General: No scleral icterus.       Right eye: No discharge.        Left eye: No discharge.     Extraocular Movements: Extraocular movements intact.     Conjunctiva/sclera: Conjunctivae normal.     Pupils: Pupils are equal, round, and reactive to light.  Neck:     Vascular: No carotid bruit.  Cardiovascular:     Rate and Rhythm: Normal rate and regular rhythm.     Pulses: Normal pulses.     Heart sounds: Murmur heard.    No friction rub. No gallop.   Pulmonary:     Effort: Pulmonary effort is normal. No respiratory distress.     Breath sounds: Normal breath sounds. No wheezing, rhonchi or rales.  Chest:     Chest wall: No tenderness.  Abdominal:     General: Bowel sounds are normal. There is no distension.     Palpations: Abdomen is soft. There is no mass.     Tenderness: There is no abdominal tenderness. There is no right CVA tenderness, left CVA tenderness, guarding or rebound.  Musculoskeletal:        General: No swelling or tenderness.     Cervical back: Normal range of motion. No rigidity or tenderness.     Right lower leg: No edema.     Left lower leg: No edema.     Comments: Unsteady gait   Lymphadenopathy:     Cervical: No cervical adenopathy.  Skin:    General: Skin is warm and dry.     Coloration: Skin is not pale.     Findings: No bruising or erythema.  Neurological:     Mental Status: She is alert. Mental status is at baseline.     Cranial Nerves: No cranial nerve deficit.     Sensory: No sensory deficit.     Motor: Weakness present.     Coordination: Coordination normal.     Gait: Gait abnormal.     Comments: Complains of dizziness when sitting at side of the bed   Psychiatric:        Mood and Affect: Mood normal.        Speech: Speech normal.        Behavior: Behavior normal.        Thought Content: Thought content normal.        Cognition and Memory: Memory is impaired.  Judgment: Judgment normal.    Labs reviewed: Recent Labs    06/13/20 0000 06/27/20 0000  NA 137   137 138  K 3.8   3.8 3.6  CL 101   101 99  CO2 26*   26* 22  BUN 18   18 19   CREATININE 0.9   0.9 0.8  CALCIUM 9.1   9.1 9.4   Recent Labs    06/13/20 0000  AST 19  ALT 11  ALBUMIN 4.3   4.3   Recent Labs    06/13/20 0000 06/27/20 0000  WBC 5.8 5.3  HGB 12.5 11.8*  HCT 37 35*  PLT 176 140*    Significant Diagnostic Results in last 30 days:  No results  found.  Assessment/Plan   Orthostatic hypotension Afebrile.  30 - 44 mmHg drop noted with change of position from lying,sitting and standing with associated dizziness.  - will start on  Midodrine 2.5 mg tablet one by mouth three times daily. - Apply knee high ted hose on in the morning and off at bedtime. - continue to monitor blood pressure  - Fall and safety precaution   Family/ staff Communication: Reviewed plan of care with patient and facility Nurse   Labs/tests ordered:None

## 2020-07-06 DIAGNOSIS — R531 Weakness: Secondary | ICD-10-CM | POA: Insufficient documentation

## 2020-07-06 DIAGNOSIS — B029 Zoster without complications: Secondary | ICD-10-CM | POA: Insufficient documentation

## 2020-07-06 DIAGNOSIS — Z9181 History of falling: Secondary | ICD-10-CM | POA: Insufficient documentation

## 2020-07-06 DIAGNOSIS — B020 Zoster encephalitis: Secondary | ICD-10-CM | POA: Insufficient documentation

## 2020-07-06 DIAGNOSIS — F0281 Dementia in other diseases classified elsewhere with behavioral disturbance: Secondary | ICD-10-CM | POA: Insufficient documentation

## 2020-07-06 DIAGNOSIS — F324 Major depressive disorder, single episode, in partial remission: Secondary | ICD-10-CM | POA: Insufficient documentation

## 2020-07-06 DIAGNOSIS — M810 Age-related osteoporosis without current pathological fracture: Secondary | ICD-10-CM | POA: Insufficient documentation

## 2020-07-06 DIAGNOSIS — G903 Multi-system degeneration of the autonomic nervous system: Secondary | ICD-10-CM | POA: Insufficient documentation

## 2020-07-06 DIAGNOSIS — R1012 Left upper quadrant pain: Secondary | ICD-10-CM | POA: Insufficient documentation

## 2020-07-06 DIAGNOSIS — G3183 Dementia with Lewy bodies: Secondary | ICD-10-CM | POA: Insufficient documentation

## 2020-07-08 ENCOUNTER — Telehealth: Payer: Self-pay

## 2020-07-08 NOTE — Telephone Encounter (Signed)
Received referral for Palliative care. Phone call placed to [facility] to follow up. VM left for Social workers, Van with contact information.

## 2020-07-11 ENCOUNTER — Non-Acute Institutional Stay: Payer: Self-pay

## 2020-07-11 VITALS — BP 100/50 | HR 80 | Resp 16

## 2020-07-11 DIAGNOSIS — Z515 Encounter for palliative care: Secondary | ICD-10-CM

## 2020-07-15 ENCOUNTER — Non-Acute Institutional Stay: Payer: Medicare HMO | Admitting: Internal Medicine

## 2020-07-15 DIAGNOSIS — F02818 Dementia in other diseases classified elsewhere, unspecified severity, with other behavioral disturbance: Secondary | ICD-10-CM

## 2020-07-15 DIAGNOSIS — Z515 Encounter for palliative care: Secondary | ICD-10-CM

## 2020-07-15 DIAGNOSIS — F0281 Dementia in other diseases classified elsewhere with behavioral disturbance: Secondary | ICD-10-CM

## 2020-07-15 DIAGNOSIS — G3183 Dementia with Lewy bodies: Secondary | ICD-10-CM

## 2020-07-15 NOTE — Progress Notes (Addendum)
[facility] Consult Note Telephone: [phone]  Fax: [phone]  PATIENT NAME: Terri [name] DOB: [birth year] MRN: [id number]  PRIMARY CARE PROVIDER:   Cena Benton, MD  REFERRING PROVIDER:  Dr. Hollace Kinnier  RESPONSIBLE PARTY:    Terri [name] [phone]  [phone]    Terri [name] [phone]  [phone]    RECOMMENDATIONS and PLAN:  Palliative care encounter  Z51.5  1.  Advance care planning:  Explanation of palliative care services.  Reviewed goals of care and advanced directives.  Pt's goals are to be discharged to home and have assistance within her home for ADLs and companionship. She would also like to control dizziness, hypotension and anxiety. Initiated discussion with patient related to advanced directives but ceased prematurely when patient became anxious with discussion.  She asked that I "discuss this with her children".  Plan on full discussion with [name]/s.  Palliative care will follow-up with patient.   2.  Orthostatic hypotension:  Remain hydrated, encouraged to change positions slowly, activities with assistance.  Consider Midodrine if hypotension continues.  3.  Depression with anxiety:  Bereavement counseling.  This could be arranged through [facility].  Strongly recommend some participation or visiting with horses at stable. Continue Lexapro 10mg  daily.  4.  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  Continue supportive care as needed.  Monitor for degree of cognitive and functional decline.     I spent 45 minutes providing this consultation,  from 1500 to 1545. More than 50% of the time in this consultation was spent coordinating communication with clinical staff and patient.   HISTORY OF PRESENT ILLNESS:  Terri [name] is a 79 y.o. year old female with multiple medical problems including chronic microvascular ischemic changes/Lewy  body dementia, spinal stenosis and depression.  She was admitted to the hospital from 7/15-7/27/21 due to syncope, orthostatic hypotension, shingles and encephalitis.  She has noticed increased anxiety since the death of her husband and current change of baseline health.  She currently requires assistance with transfers and ADLs.  She is able to feed self. 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  She is anxious to return home to her pet cat.  Palliative Care was asked to help address goals of care.   CODE STATUS: FULL CODE  PPS: 40%  HOSPICE ELIGIBILITY/DIAGNOSIS: TBD  PAST MEDICAL HISTORY:  Past Medical History:  Diagnosis Date  . Arthritis   . Bladder disorder    "has bladder stem dilated approx. every 6 months"  . Bronchitis    hx of  . Cancer ([place])    skin ca lesions removed  . Complication of anesthesia    "states had coughing, chest pain, difficulty swallowing after going home, saw by [facility] doctor, issue resolved with medicine"  . Cough    hx of recent cough, cold, sinus drainage, on antibiotics at this time"  . Depression    on medication x 2 years  . Diverticulosis    also history of colon polyps  . GERD (gastroesophageal reflux disease)    "not on any medication at this time"  . Heart murmur    "mild heart murmur, sees Dr. Mike Gip"  . Hyperlipidemia    hx of  . Hypertension    hx of, "not on medication at this time" followed by Dr. Alvy Bimler"  . Pneumonia    "as a child"  . PONV (postoperative  nausea and vomiting)   . Seasonal allergies    hx of  . Sleep apnea    uses cpap machine, last sleep study over 5 years ago  . Spinal stenosis    hx of  . UTI (urinary tract infection) 01/13/13   Pt diagnosed by family doctor; prescribed antibiotic    ALLERGIES:  Allergies  Allergen Reactions  . Darvon [Propoxyphene Hcl] Nausea And Vomiting  . Dicyclomine Hcl Other (See Comments)    Dizziness  . Inderal  [Propranolol] Nausea And Vomiting  . Piroxicam   . Ciprofloxacin Rash  . Erythromycin Rash     PERTINENT MEDICATIONS:  Outpatient Encounter Medications as of 07/15/2020  Medication Sig  . acetaminophen (TYLENOL) 500 MG tablet Take 500 mg by mouth every 6 (six) hours as needed.  . Calcium Carb-Cholecalciferol (CALCIUM 500 + D3) 500-200 MG-UNIT TABS Take by mouth.  . denosumab (PROLIA) 60 MG/ML SOSY injection Inject 60 mg into the skin every 6 (six) months.  . escitalopram (LEXAPRO) 10 MG tablet Take 10 mg by mouth daily.  Marland Kitchen FIBER PO Take 1 tablet by mouth daily.  Marland Kitchen lidocaine (LIDODERM) 5 % Place 1 patch onto the skin daily. Apply patch to lower back (where shingles was ) on in AM for Shingles pain/post- herpetic neuralgia.  . Melatonin 10 MG TABS Take 10 mg by mouth at bedtime.  . memantine (NAMENDA) 5 MG tablet Take 5 mg by mouth 2 (two) times daily.  . pantoprazole (PROTONIX) 20 MG tablet Take 20 mg by mouth 2 (two) times daily. Every morning and every night  . rivastigmine (EXELON) 3 MG capsule Take 3 mg by mouth 2 (two) times daily.  . simethicone (MYLICON) 771 MG chewable tablet Chew 125 mg by mouth every 6 (six) hours as needed for flatulence.  . simvastatin (ZOCOR) 40 MG tablet Take 40 mg by mouth every evening.  . vitamin B-12 (CYANOCOBALAMIN) 1000 MCG tablet Take 1,000 mcg by mouth daily.   No facility-administered encounter medications on file as of 07/15/2020.    PHYSICAL EXAM:   General: NAD, well nourished elderly female sitting in recliner Cardiovascular: regular rate and rhythm  BP 155/81 Pulmonary: clear throughout Abdomen: soft, nontender, + bowel sounds Extremities: trace edema BLE, compression socks in use  Skin: exposed skin is intact Neurological: A&O x2.  Fluid speech. Forgetful related to detailed information Psych:  Anxious mood but cooperative.  Tearful when discussing advanced directives.  Gonzella Lex, NP-C

## 2020-07-16 ENCOUNTER — Other Ambulatory Visit: Payer: Self-pay

## 2020-07-18 ENCOUNTER — Encounter: Payer: Self-pay | Admitting: Family

## 2020-07-18 ENCOUNTER — Non-Acute Institutional Stay (SKILLED_NURSING_FACILITY): Payer: Medicare HMO | Admitting: Family

## 2020-07-18 DIAGNOSIS — F324 Major depressive disorder, single episode, in partial remission: Secondary | ICD-10-CM | POA: Diagnosis not present

## 2020-07-18 DIAGNOSIS — E785 Hyperlipidemia, unspecified: Secondary | ICD-10-CM | POA: Diagnosis not present

## 2020-07-18 DIAGNOSIS — G3183 Dementia with Lewy bodies: Secondary | ICD-10-CM

## 2020-07-18 DIAGNOSIS — M47816 Spondylosis without myelopathy or radiculopathy, lumbar region: Secondary | ICD-10-CM

## 2020-07-18 DIAGNOSIS — R2681 Unsteadiness on feet: Secondary | ICD-10-CM | POA: Diagnosis not present

## 2020-07-18 DIAGNOSIS — K219 Gastro-esophageal reflux disease without esophagitis: Secondary | ICD-10-CM

## 2020-07-18 DIAGNOSIS — I951 Orthostatic hypotension: Secondary | ICD-10-CM | POA: Diagnosis not present

## 2020-07-18 DIAGNOSIS — F0281 Dementia in other diseases classified elsewhere with behavioral disturbance: Secondary | ICD-10-CM

## 2020-07-18 MED ORDER — SIMVASTATIN 40 MG PO TABS
40.0000 mg | ORAL_TABLET | Freq: Every evening | ORAL | 0 refills | Status: AC
Start: 2020-07-18 — End: ?

## 2020-07-18 MED ORDER — ESCITALOPRAM OXALATE 10 MG PO TABS: 10.0000 mg | ORAL_TABLET | Freq: Every day | ORAL | 0 refills | Status: AC

## 2020-07-18 MED ORDER — LIDOCAINE 5 % EX PTCH: 1.0000 | MEDICATED_PATCH | CUTANEOUS | 0 refills | Status: AC

## 2020-07-18 MED ORDER — SIMETHICONE 125 MG PO CHEW: 125.0000 mg | CHEWABLE_TABLET | Freq: Four times a day (QID) | ORAL | 0 refills | Status: AC | PRN

## 2020-07-18 MED ORDER — MEMANTINE HCL 5 MG PO TABS: 5.0000 mg | ORAL_TABLET | Freq: Two times a day (BID) | ORAL | 0 refills | Status: AC

## 2020-07-18 MED ORDER — RIVASTIGMINE TARTRATE 3 MG PO CAPS: 3.0000 mg | ORAL_CAPSULE | Freq: Two times a day (BID) | ORAL | 0 refills | Status: AC

## 2020-07-18 MED ORDER — MIDODRINE HCL 2.5 MG PO TABS: 2.5000 mg | ORAL_TABLET | Freq: Three times a day (TID) | ORAL | 0 refills | Status: AC

## 2020-07-18 MED ORDER — PANTOPRAZOLE SODIUM 20 MG PO TBEC: 20.0000 mg | DELAYED_RELEASE_TABLET | Freq: Two times a day (BID) | ORAL | 0 refills | Status: AC

## 2020-07-18 NOTE — Progress Notes (Signed)
PATIENT NAME: JASIA HILTUNEN DOB: 29-Apr-1941 MRN: [id number]  PRIMARY CARE PROVIDER: Cena Benton, MD  RESPONSIBLE PARTY:  Acct ID - Guarantor Home Phone Work Phone Relationship Acct Type  [id number] - [place],[phone]  Self P/F     [address], [place], [place] [address]    PLAN OF CARE and INTERVENTIONS:               1.  GOALS OF CARE/ ADVANCE CARE PLANNING: Patient expressed goal is to be safe, prevent falls and to be able to care for herself as much as possible               2.  PATIENT/CAREGIVER EDUCATION: reinforced education on fall prevention                            3.  DISEASE STATUS: Met with patient in her room. Facility RN present and provided update on patient. Medical history include but not limited to chronic microvasular ischemia, Lewy Body dementia, spinal stenosis and depression. Patient currently at [facility] hospitalization.   HISTORY OF PRESENT ILLNESS:  This is a 79 year old female, Palliative care will continue to follow   CODE STATUS: Full Code ADVANCED DIRECTIVES: N MOST FORM:N PPS: 40%   PHYSICAL EXAM:   VITALS: Today's Vitals   07/12/20 1246  BP: (!) 100/50  Pulse: 80  Resp: 16  SpO2: 99%  PainSc: 0-No pain    LUNGS: CTA CARDIAC: RRR EXTREMITIES: Trace edema. Compression stockings SKIN: No apparent concerns. NEURO: Awake, engaging. Oriented x 2.  weakness.        Cornelius Moras, RN

## 2020-07-18 NOTE — Progress Notes (Signed)
Location:  [facility] Room Number: 505-P Place of Service:  SNF [phone])  Provider: Marlowe Sax FNP-C   PCP: Cena Benton, MD Patient Care Team: Cena Benton, MD as PCP - General (Internal Medicine) Roel Cluck, MD as Referring Physician (Ophthalmology) Sofie Rower, [place] (Internal Medicine)  Extended Emergency Contact Information Primary Emergency Contact: Tilman Neat of [place] Phone: [phone] Mobile Phone: [phone] Relation: Daughter Secondary Emergency Contact: Valinda Party [place] of [place] Phone: [phone] Mobile Phone: [phone] Relation: Daughter  Code Status:Full Code  Goals of care:  Advanced Directive information Advanced Directives 07/18/2020  Does Patient Have a Medical Advance Directive? Yes  Type of Advance Directive Living will  Does patient want to make changes to medical advance directive? No - Patient declined  Copy of [place] in Chart? -  Pre-existing out of facility DNR order (yellow form or pink MOST form) -     Allergies  Allergen Reactions   Darvon [Propoxyphene Hcl] Nausea And Vomiting   Dicyclomine Hcl Other (See Comments)    Dizziness   Inderal [Propranolol] Nausea And Vomiting   Piroxicam    Ciprofloxacin Rash   Erythromycin Rash    Chief Complaint  Patient presents with   Discharge Note    Discharge from SNF home with family support 07/18/2020.    HPI:  79 y.o. female seen today at [facility] for discharge home with family support 07/18/2020.she has a medical history of Lewy body dementia with behavioral disturbance,Depression,DDD of lumbar spine/hip ,OSA,osteoporosis,PVD,meningioma,Meniere's disease,generalized anxiety urge incontinence among others.she is seen in her room today states ready to go home.she was here for short term rehabilitation for post hospital admission at [facility]  from 06/23/2020 - 06/25/2020 for syncope due to orthostatic hypotension.she had generalized weakness and unwitnessed fall on 06/20/2020 there was concern of compression fracture due to tenderness on her thoracic spine. Her Thoracic and Lumbar X-ray were negative but showed multilevel spondylosis on L2-L3 and multilevel DDD.Amlodipine 5 mg tablet had been started recently.Amlodipine was discontinued.also thought her orthostatic symptoms were due to some neurogenic due to LBD.Her EKG was stable and ECHO was reassuring.Her symptoms improved with use of thigh- high compression stockings, fluid challenge and abdominal binder.  Prior to admission she had increased confusion on 06/16/2020 and was found to have shingles L5 /S1 distribution 7/19.she presented with left sided abdominal pain.CT ABD/pelvis was negative.she was treated with Valacyclovir 1 Gm three times daily.due to ongoing encephalopathy there was concern for meningitis so was switched to I.V Acyclovir every 8 hrs.ID was consulted and LP recommended.PCR and CSF was positive for VZV so Acyclovir was continued until 8/3 Gabapentin avoided due to LBD. During her stay here in rehab she had Orthostatic blood pressure started her on midodrine 2.5 mg tablet three times daily with much improvement.    She has worked well with PT/OT now stable for discharge home.She will be discharged home with Home health PT/OT to continue with ROM, Exercise, Gait stability and muscle strengthening.she will also require Home health Nurse for blood pressure monitoring.She does not require any DME has own Rolling to allow her to maintain current level of independence with ADL's. Also need Home health Nurse Aide to assist with her Activity of Daily living due to high risk for falls.Home health services will be arranged by facility social worker prior to discharge. Prescription medication will be written x 1 month then patient  to follow up with PCP in 1-2 weeks.she  denies any acute issues this visit. Facility staff report no new concerns.   Past Medical History:  Diagnosis Date   Arthritis    Bladder disorder    "has bladder stem dilated approx. every 6 months"   Bronchitis    hx of   Cancer ([place])    skin ca lesions removed   Complication of anesthesia    "states had coughing, chest pain, difficulty swallowing after going home, saw by [facility] doctor, issue resolved with medicine"   Cough    hx of recent cough, cold, sinus drainage, on antibiotics at this time"   Depression    on medication x 2 years   Diverticulosis    also history of colon polyps   GERD (gastroesophageal reflux disease)    "not on any medication at this time"   Heart murmur    "mild heart murmur, sees Dr. Mike Gip"   Hyperlipidemia    hx of   Hypertension    hx of, "not on medication at this time" followed by Dr. Alvy Bimler"   Pneumonia    "as a child"   PONV (postoperative nausea and vomiting)    Seasonal allergies    hx of   Sleep apnea    uses cpap machine, last sleep study over 5 years ago   Spinal stenosis    hx of   UTI (urinary tract infection) 01/13/13   Pt diagnosed by family doctor; prescribed antibiotic    Past Surgical History:  Procedure Laterality Date   BREAST SURGERY     breast biopsy done   CARDIOVASCULAR STRESS TEST     hx of   CARPAL TUNNEL RELEASE     hx of   EYE SURGERY     bilateral cataract surgery, hx of macular hole repair   GANGLION CYST EXCISION     top of right foot   LIPOMA EXCISION     hx of   SUPERFICIAL LYMPH NODE BIOPSY / EXCISION        reports that she has never smoked. She has never used smokeless tobacco. She reports that she does not drink alcohol and does not use drugs. Social History   Socioeconomic History   Marital status: Married    Spouse name: Not on file   Number of children: Not on file   Years of education: Not on file   Highest education level: Not on file    Occupational History   Not on file  Tobacco Use   Smoking status: Never Smoker   Smokeless tobacco: Never Used  Substance and Sexual Activity   Alcohol use: Never   Drug use: No   Sexual activity: Not Currently  Other Topics Concern   Not on file  Social History Narrative   Not on file   Social Determinants of Health   Financial Resource Strain:    Difficulty of Paying Living Expenses: Not on file  Food Insecurity:    Worried About [place] in the Last Year: Not on file   Ran Out of Food in the Last Year: Not on file  Transportation Needs:    Lack of Transportation (Medical): Not on file   Lack of Transportation (Non-Medical): Not on file  Physical Activity:    Days of Exercise per Week: Not on file   Minutes of Exercise per Session: Not on file  Stress:    Feeling of Stress : Not on file  Social  Connections:    Frequency of Communication with Friends and Family: Not on file   Frequency of Social Gatherings with Friends and Family: Not on file   Attends Religious Services: Not on file   Active Member of Clubs or Organizations: Not on file   Attends Archivist Meetings: Not on file   Marital Status: Not on file  Intimate Partner Violence:    Fear of Current or Ex-Partner: Not on file   Emotionally Abused: Not on file   Physically Abused: Not on file   Sexually Abused: Not on file   Functional Status Survey:    Allergies  Allergen Reactions   Darvon [Propoxyphene Hcl] Nausea And Vomiting   Dicyclomine Hcl Other (See Comments)    Dizziness   Inderal [Propranolol] Nausea And Vomiting   Piroxicam    Ciprofloxacin Rash   Erythromycin Rash    Pertinent  Health Maintenance Due  Topic Date Due   DEXA SCAN  Never done   PNA vac Low Risk Adult (1 of 2 - PCV13) Never done   INFLUENZA VACCINE  06/30/2020    Medications: Outpatient Encounter Medications as of 07/18/2020  Medication Sig   acetaminophen  (TYLENOL) 500 MG tablet Take 500 mg by mouth every 6 (six) hours as needed.   Calcium Carb-Cholecalciferol (CALCIUM 500 + D3) 500-200 MG-UNIT TABS Take by mouth.   denosumab (PROLIA) 60 MG/ML SOSY injection Inject 60 mg into the skin every 6 (six) months.   escitalopram (LEXAPRO) 10 MG tablet Take 1 tablet (10 mg total) by mouth daily.   FIBER PO Take 1 tablet by mouth daily.   lidocaine (LIDODERM) 5 % Place 1 patch onto the skin daily. Apply patch to lower back (where shingles was ) on in AM for Shingles pain/post- herpetic neuralgia.   Melatonin 10 MG TABS Take 10 mg by mouth at bedtime.   memantine (NAMENDA) 5 MG tablet Take 1 tablet (5 mg total) by mouth 2 (two) times daily.   midodrine (PROAMATINE) 2.5 MG tablet Take 1 tablet (2.5 mg total) by mouth 3 (three) times daily with meals.   pantoprazole (PROTONIX) 20 MG tablet Take 1 tablet (20 mg total) by mouth 2 (two) times daily. Every morning and every night   rivastigmine (EXELON) 3 MG capsule Take 1 capsule (3 mg total) by mouth 2 (two) times daily.   simethicone (MYLICON) 030 MG chewable tablet Chew 1 tablet (125 mg total) by mouth every 6 (six) hours as needed for flatulence.   simvastatin (ZOCOR) 40 MG tablet Take 1 tablet (40 mg total) by mouth every evening.   vitamin B-12 (CYANOCOBALAMIN) 1000 MCG tablet Take 1,000 mcg by mouth daily.   [DISCONTINUED] escitalopram (LEXAPRO) 10 MG tablet Take 10 mg by mouth daily.   [DISCONTINUED] lidocaine (LIDODERM) 5 % Place 1 patch onto the skin daily. Apply patch to lower back (where shingles was ) on in AM for Shingles pain/post- herpetic neuralgia.   [DISCONTINUED] memantine (NAMENDA) 5 MG tablet Take 5 mg by mouth 2 (two) times daily.   [DISCONTINUED] midodrine (PROAMATINE) 2.5 MG tablet Take 2.5 mg by mouth 3 (three) times daily with meals.   [DISCONTINUED] pantoprazole (PROTONIX) 20 MG tablet Take 20 mg by mouth 2 (two) times daily. Every morning and every night    [DISCONTINUED] rivastigmine (EXELON) 3 MG capsule Take 3 mg by mouth 2 (two) times daily.   [DISCONTINUED] simethicone (MYLICON) 092 MG chewable tablet Chew 125 mg by mouth every 6 (six) hours as needed  for flatulence.   [DISCONTINUED] simvastatin (ZOCOR) 40 MG tablet Take 40 mg by mouth every evening.   No facility-administered encounter medications on file as of 07/18/2020.     Review of Systems  Constitutional: Negative for appetite change, chills, fatigue and fever.  HENT: Negative for congestion, rhinorrhea, sinus pressure, sinus pain, sneezing, sore throat and trouble swallowing.   Eyes: Negative for discharge, redness and itching.  Respiratory: Negative for cough, chest tightness, shortness of breath and wheezing.   Cardiovascular: Negative for chest pain, palpitations and leg swelling.  Gastrointestinal: Negative for abdominal distention, abdominal pain, constipation, diarrhea, nausea and vomiting.  Endocrine: Negative for cold intolerance, heat intolerance, polydipsia, polyphagia and polyuria.  Genitourinary: Negative for difficulty urinating, dysuria, flank pain, frequency and urgency.  Musculoskeletal: Positive for gait problem. Negative for joint swelling and myalgias.  Skin: Negative for color change, pallor and rash.  Neurological: Negative for dizziness, seizures, speech difficulty, weakness, light-headedness, numbness and headaches.  Hematological: Does not bruise/bleed easily.  Psychiatric/Behavioral: Negative for agitation, behavioral problems and sleep disturbance. The patient is not nervous/anxious.     Vitals:   07/18/20 0942  BP: 110/61  Pulse: 81  Resp: 17  Temp: 97.8 F (36.6 C)  SpO2: 98%  Weight: 153 lb (69.4 kg)  Height: 5\' 4"  (1.626 m)   Body mass index is 26.26 kg/m. Physical Exam Constitutional:      General: She is not in acute distress.    Appearance: She is overweight. She is not ill-appearing.  HENT:     Head: Normocephalic.     Nose: Nose  normal. No congestion or rhinorrhea.     Mouth/Throat:     Mouth: Mucous membranes are moist.     Pharynx: Oropharynx is clear. No oropharyngeal exudate or posterior oropharyngeal erythema.  Eyes:     General: No scleral icterus.       Right eye: No discharge.        Left eye: No discharge.     Conjunctiva/sclera: Conjunctivae normal.     Pupils: Pupils are equal, round, and reactive to light.  Cardiovascular:     Rate and Rhythm: Normal rate and regular rhythm.     Pulses: Normal pulses.     Heart sounds: Murmur heard.  No friction rub. No gallop.   Pulmonary:     Effort: Pulmonary effort is normal. No respiratory distress.     Breath sounds: Normal breath sounds. No wheezing, rhonchi or rales.  Chest:     Chest wall: No tenderness.  Abdominal:     General: Bowel sounds are normal. There is no distension.     Palpations: Abdomen is soft. There is no mass.     Tenderness: There is no abdominal tenderness. There is no right CVA tenderness, left CVA tenderness, guarding or rebound.  Musculoskeletal:        General: No swelling or tenderness.     Cervical back: Normal range of motion. No rigidity or tenderness.     Right lower leg: No edema.     Left lower leg: No edema.     Comments: Unsteady gait walking with a Rolling walker   Lymphadenopathy:     Cervical: No cervical adenopathy.  Skin:    General: Skin is warm and dry.     Coloration: Skin is not pale.     Findings: No bruising, erythema or rash.  Neurological:     Mental Status: She is alert.     Cranial Nerves: No cranial nerve  deficit.     Motor: No weakness.     Coordination: Coordination normal.     Gait: Gait abnormal.     Comments: Alert and oriented to person and place   Psychiatric:        Mood and Affect: Mood normal.        Speech: Speech normal.        Behavior: Behavior is cooperative.        Thought Content: Thought content normal.    Labs reviewed: Basic Metabolic Panel: Recent Labs     06/13/20 0000 06/27/20 0000  NA 137   137 138  K 3.8   3.8 3.6  CL 101   101 99  CO2 26*   26* 22  BUN 18   18 19   CREATININE 0.9   0.9 0.8  CALCIUM 9.1   9.1 9.4   Liver Function Tests: Recent Labs    06/13/20 0000  AST 19  ALT 11  ALBUMIN 4.3   4.3    CBC: Recent Labs    06/13/20 0000 06/27/20 0000  WBC 5.8 5.3  HGB 12.5 11.8*  HCT 37 35*  PLT 176 140*   Cardiac Enzymes:  Procedures and Imaging Studies During Stay: No results found.  Assessment/Plan:    1. Unsteady gait Has worked well with PT/ OT.she will discharge home PT/OT to continue with ROM, Exercise, Gait stability and muscle strengthening. No new DME required  Has rolling walker  to allow her to maintain current level of independence with ADL's. Fall and safety precautions. CBC, BMP in 1-2 weeks PCP   2. Orthostatic hypotension Has improved on midodrine.No dizziness reported with change of position.  -  HH RN for blood pressure monitoring  - midodrine (PROAMATINE) 2.5 MG tablet; Take 1 tablet (2.5 mg total) by mouth 3 (three) times daily with meals.  Dispense: 90 tablet; Refill: 0 CBC, BMP in 1-2 weeks PCP   3. Hyperlipidemia, unspecified hyperlipidemia type No Latest LDL for review. Continue on simvastatin.  - simvastatin (ZOCOR) 40 MG tablet; Take 1 tablet (40 mg total) by mouth every evening.  Dispense: 30 tablet; Refill: 0  4. Depression, major, single episode, in partial remission (HCC) Mood stable. - continue on escitalopram.  - escitalopram (LEXAPRO) 10 MG tablet; Take 1 tablet (10 mg total) by mouth daily.  Dispense: 30 tablet; Refill: 0  5. Spondylosis of lumbar region without myelopathy or radiculopathy Pain under control on current regimen. - lidocaine (LIDODERM) 5 %; Place 1 patch onto the skin daily. Apply patch to lower back (where shingles was ) on in AM for Shingles pain/post- herpetic neuralgia.  Dispense: 30 patch; Refill: 0  6. Gastroesophageal reflux disease without  esophagitis Symptoms controlled.continue on Protonix - pantoprazole (PROTONIX) 20 MG tablet; Take 1 tablet (20 mg total) by mouth 2 (two) times daily. Every morning and every night  Dispense: 60 tablet; Refill: 0  7. Lewy body dementia with behavioral disturbance ([place]) No new behavioral issues. - continue on memantine and rivastigmine.  - memantine (NAMENDA) 5 MG tablet; Take 1 tablet (5 mg total) by mouth 2 (two) times daily.  Dispense: 60 tablet; Refill: 0 - rivastigmine (EXELON) 3 MG capsule; Take 1 capsule (3 mg total) by mouth 2 (two) times daily.  Dispense: 60 capsule; Refill: 0  Patient is being discharged with the following home health services:   -PT/OT for ROM, exercise, gait stability and muscle strengthening -  HH RN for blood pressure monitoring  - HH  Aid for ADL's assist   Patient is being discharged with the following durable medical equipment:   No DME required has own Rolling walker.  Patient has been advised to f/u with their PCP in 1-2 weeks to for a transitions of care visit.Social services at their facility was responsible for arranging this appointment.  Pt was provided with adequate prescriptions of noncontrolled medications to reach the scheduled appointment.For controlled substances, a limited supply was provided as appropriate for the individual patient. If the pt normally receives these medications from a pain clinic or has a contract with another physician, these medications should be received from that clinic or physician only).    Future labs/tests needed:  CBC, BMP in 1-2 weeks PCP

## 2020-07-19 DIAGNOSIS — M1990 Unspecified osteoarthritis, unspecified site: Secondary | ICD-10-CM

## 2020-07-19 DIAGNOSIS — F0281 Dementia in other diseases classified elsewhere with behavioral disturbance: Secondary | ICD-10-CM

## 2020-07-19 DIAGNOSIS — K589 Irritable bowel syndrome without diarrhea: Secondary | ICD-10-CM

## 2020-07-19 DIAGNOSIS — I951 Orthostatic hypotension: Secondary | ICD-10-CM

## 2020-07-19 DIAGNOSIS — G4733 Obstructive sleep apnea (adult) (pediatric): Secondary | ICD-10-CM

## 2020-07-19 DIAGNOSIS — G3183 Dementia with Lewy bodies: Secondary | ICD-10-CM

## 2020-07-19 DIAGNOSIS — F419 Anxiety disorder, unspecified: Secondary | ICD-10-CM

## 2020-07-19 DIAGNOSIS — E785 Hyperlipidemia, unspecified: Secondary | ICD-10-CM

## 2020-07-31 ENCOUNTER — Telehealth: Payer: Self-pay | Admitting: Internal Medicine

## 2020-07-31 NOTE — Telephone Encounter (Signed)
Called patient's home number and spoke with daughter, [name], regarding continuing Palliative services in the home.  Palliative NP was seeing patient when she was at [facility] and daughter was in agreement with continuing services in the home.  I have scheduled an In-person Consult for 08/23/20 @ 1 PM.  Patient is also receiving services with [facility].

## 2020-08-23 ENCOUNTER — Other Ambulatory Visit: Payer: Medicare HMO | Admitting: Internal Medicine

## 2020-08-23 ENCOUNTER — Other Ambulatory Visit: Payer: Self-pay

## 2020-08-23 DIAGNOSIS — F0281 Dementia in other diseases classified elsewhere with behavioral disturbance: Secondary | ICD-10-CM

## 2020-08-23 DIAGNOSIS — G3183 Dementia with Lewy bodies: Secondary | ICD-10-CM

## 2020-08-23 DIAGNOSIS — Z515 Encounter for palliative care: Secondary | ICD-10-CM

## 2020-08-23 NOTE — Progress Notes (Signed)
[facility] Consult Note Telephone: [phone]  Fax: [phone]  PATIENT NAME: Terri [name] DOB: 1941-06-26 MRN: [id number]  PRIMARY CARE PROVIDER:   Cena Benton, MD  REFERRING PROVIDER:  Dr. Hollace [name]  RESPONSIBLE PARTYSonda [name] Daughter [phone]  [phone]    Terri [name] Daughter [phone]  [phone]    RECOMMENDATIONS and PLAN:  Palliative care encounter  Z51.5  1.  Advance care planning:  Explanation of palliative care services.  Reviewed goals of care and advanced directives.  Pt's goals are to be discharged to home and have assistance within her home for ADLs and companionship. She would also like to control dizziness, hypotension and anxiety. Initiated discussion with patient related to advanced directives but ceased prematurely when patient became anxious with discussion.  She asked that I "discuss this with her children".  Plan on full discussion with daughter/s.  Palliative care will follow-up with patient.   2.  Orthostatic hypotension:  Improved. Remain hydrated, encouraged to change positions slowly, activities with assistance.  Consider Midodrine if hypotension continues.  3.  Depression with anxiety:  Bereavement counseling.  This could be arranged through [facility].  Strongly recommend some participation or visiting with horses at stable. Continue Lexapro 10mg  daily.  4.  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  Continue supportive care as needed.  Monitor for degree of cognitive and functional decline.     I spent 45 minutes providing this consultation,  from 1500 to 1545. More than 50% of the time in this consultation was spent coordinating communication with clinical staff and patient.   HISTORY OF PRESENT ILLNESS:  Terri [name] is a 79 y.o. year old female with multiple medical problems including chronic microvascular ischemic  changes/Lewy body dementia, spinal stenosis and depression.  She was admitted to the hospital from 7/15-7/27/21 due to syncope, orthostatic hypotension, shingles and encephalitis.  She has noticed increased anxiety since the death of her husband and current change of baseline health.  She currently requires assistance with transfers and ADLs.  She is able to feed self. 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  She is anxious to return home to her pet cat.  Palliative Care was asked to help address goals of care.   CODE STATUS: FULL CODE  PPS: 40%  HOSPICE ELIGIBILITY/DIAGNOSIS: TBD  PAST MEDICAL HISTORY:  Past Medical History:  Diagnosis Date  . Arthritis   . Bladder disorder    "has bladder stem dilated approx. every 6 months"  . Bronchitis    hx of  . Cancer ([place])    skin ca lesions removed  . Complication of anesthesia    "states had coughing, chest pain, difficulty swallowing after going home, saw by [facility] doctor, issue resolved with medicine"  . Cough    hx of recent cough, cold, sinus drainage, on antibiotics at this time"  . Depression    on medication x 2 years  . Diverticulosis    also history of colon polyps  . GERD (gastroesophageal reflux disease)    "not on any medication at this time"  . Heart murmur    "mild heart murmur, sees Dr. Mike [name]"  . Hyperlipidemia    hx of  . Hypertension    hx of, "not on medication at this time" followed by Dr. Alvy [name]"  . Pneumonia    "as a child"  . PONV (  postoperative nausea and vomiting)   . Seasonal allergies    hx of  . Sleep apnea    uses cpap machine, last sleep study over 5 years ago  . Spinal stenosis    hx of  . UTI (urinary tract infection) 01/13/13   Pt diagnosed by family doctor; prescribed antibiotic    ALLERGIES:  Allergies  Allergen Reactions  . Darvon [Propoxyphene Hcl] Nausea And Vomiting  . Dicyclomine Hcl Other (See Comments)    Dizziness  . Inderal  [Propranolol] Nausea And Vomiting  . Piroxicam   . Ciprofloxacin Rash  . Erythromycin Rash     PERTINENT MEDICATIONS:  Outpatient Encounter Medications as of 07/15/2020  Medication Sig  . acetaminophen (TYLENOL) 500 MG tablet Take 500 mg by mouth every 6 (six) hours as needed.  . Calcium Carb-Cholecalciferol (CALCIUM 500 + D3) 500-200 MG-UNIT TABS Take by mouth.  . denosumab (PROLIA) 60 MG/ML SOSY injection Inject 60 mg into the skin every 6 (six) months.  . escitalopram (LEXAPRO) 10 MG tablet Take 10 mg by mouth daily.  Marland Kitchen FIBER PO Take 1 tablet by mouth daily.  Marland Kitchen lidocaine (LIDODERM) 5 % Place 1 patch onto the skin daily. Apply patch to lower back (where shingles was ) on in AM for Shingles pain/post- herpetic neuralgia.  . Melatonin 10 MG TABS Take 10 mg by mouth at bedtime.  . memantine (NAMENDA) 5 MG tablet Take 5 mg by mouth 2 (two) times daily.  . pantoprazole (PROTONIX) 20 MG tablet Take 20 mg by mouth 2 (two) times daily. Every morning and every night  . rivastigmine (EXELON) 3 MG capsule Take 3 mg by mouth 2 (two) times daily.  . simethicone (MYLICON) 815 MG chewable tablet Chew 125 mg by mouth every 6 (six) hours as needed for flatulence.  . simvastatin (ZOCOR) 40 MG tablet Take 40 mg by mouth every evening.  . vitamin B-12 (CYANOCOBALAMIN) 1000 MCG tablet Take 1,000 mcg by mouth daily.   No facility-administered encounter medications on file as of 07/15/2020.    PHYSICAL EXAM:   General: NAD, well nourished elderly female sitting in recliner Cardiovascular: regular rate and rhythm  BP 155/81 Pulmonary: clear throughout Abdomen: soft, nontender, + bowel sounds Extremities: trace edema BLE, compression socks in use  Skin: exposed skin is intact Neurological: A&O x2.  Fluid speech. Forgetful related to detailed information Psych:  Anxious mood but cooperative.  Tearful when discussing advanced directives.  Terri Lex, NP-C

## 2020-08-27 NOTE — Telephone Encounter (Signed)
Error

## 2020-09-06 ENCOUNTER — Other Ambulatory Visit: Payer: Self-pay | Admitting: Family

## 2020-09-06 DIAGNOSIS — E785 Hyperlipidemia, unspecified: Secondary | ICD-10-CM

## 2020-09-12 ENCOUNTER — Other Ambulatory Visit: Payer: Medicare HMO | Admitting: Internal Medicine

## 2020-09-15 ENCOUNTER — Other Ambulatory Visit: Payer: Self-pay | Admitting: Family

## 2020-09-15 DIAGNOSIS — K219 Gastro-esophageal reflux disease without esophagitis: Secondary | ICD-10-CM

## 2020-09-15 NOTE — Progress Notes (Addendum)
[place] Consult Note Telephone: [phone]  Fax: [phone]  PATIENT NAME: Terri [name] DOB: 1941-07-13 MRN: [id number]  PRIMARY CARE PROVIDER:   Cena Benton, MD  REFERRING PROVIDER:  Dr. Duwaine Maxin  RESPONSIBLE PARTYSonda [name] Daughter [phone]  POA    Terri [name] Daughter [phone]  HCPOA    RECOMMENDATIONS and PLAN:  Palliative care encounter  Z51.5  1.  Advance care planning: Re-addressed advanced directives and goals of care with patient and daugther, Terri [name]. MOST form was explained, completed and uploaded into [facility] delegations of attempt CPR, full scope of treatment, antibiotics and IV fluids if indicated but no tube feedings.   Daughters will continue to support while she lives at her own residence.  Pt needs minimal assistance with meals and medications.  Palliative care will follow-up with patient in aprox 6 weeks.    2.  Orthostatic hypotension:  More stable.  Continue Midodrine 5mg  tid. Remain hydrated, encouraged to change positions slowly, activities with assistance when needed. Monitor   3.  Depression with anxiety: Improved since return to her home.  Bereavement counseling contact info provided to request an appointment. Encouraged participation in adult day program(decline by patient) Continue Lexapro 10mg  daily.  4.  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 Highly functional.   Continue cueing, supportive care and Namenda 5mg  bid.  Monitor for degree of cognitive and functional decline.      I spent 45 minutes providing this consultation,  from 1500 to 1545. More than 50% of the time in this consultation was spent coordinating communication with clinical staff and patient.   HISTORY OF PRESENT ILLNESS: Follow-up with Terri [name].   I am here today to readdress advance care planning, goals of care along with f/u on orthostatic hypotension, depression  and dementia since being discharged from a snf. Patient and daughter deny any recurrent syncopal episodes but she does have some intermittent dizziness with movement.  No reported behaviors. Palliative Care was asked to help address goals of care.   CODE STATUS: FULL CODE  PPS: 60%  HOSPICE ELIGIBILITY/DIAGNOSIS: TBD  PAST MEDICAL HISTORY:  Past Medical History:  Diagnosis Date  . Arthritis   . Bladder disorder    "has bladder stem dilated approx. every 6 months"  . Bronchitis    hx of  . Cancer ([place])    skin ca lesions removed  . Complication of anesthesia    "states had coughing, chest pain, difficulty swallowing after going home, saw by [facility] doctor, issue resolved with medicine"  . Cough    hx of recent cough, cold, sinus drainage, on antibiotics at this time"  . Depression    on medication x 2 years  . Diverticulosis    also history of colon polyps  . GERD (gastroesophageal reflux disease)    "not on any medication at this time"  . Heart murmur    "mild heart murmur, sees Dr. Mike Gip"  . Hyperlipidemia    hx of  . Hypertension    hx of, "not on medication at this time" followed by Dr. Alvy Bimler"  . Pneumonia    "as a child"  . PONV (postoperative nausea and vomiting)   . Seasonal allergies    hx of  . Sleep apnea    uses cpap machine, last sleep study over 5 years ago  . Spinal stenosis    hx of  . UTI (urinary tract infection)  01/13/13   Pt diagnosed by family doctor; prescribed antibiotic    ALLERGIES:  Allergies  Allergen Reactions  . Darvon [Propoxyphene Hcl] Nausea And Vomiting  . Dicyclomine Hcl Other (See Comments)    Dizziness  . Inderal [Propranolol] Nausea And Vomiting  . Piroxicam   . Ciprofloxacin Rash  . Erythromycin Rash     PERTINENT MEDICATIONS:  Outpatient Encounter Medications as of 07/15/2020  Medication Sig  . acetaminophen (TYLENOL) 500 MG tablet Take 500 mg by mouth every 6 (six) hours as needed.  . Calcium  Carb-Cholecalciferol (CALCIUM 500 + D3) 500-200 MG-UNIT TABS Take by mouth.  . denosumab (PROLIA) 60 MG/ML SOSY injection Inject 60 mg into the skin every 6 (six) months.  . escitalopram (LEXAPRO) 10 MG tablet Take 10 mg by mouth daily.  Marland Kitchen FIBER PO Take 1 tablet by mouth daily.  Marland Kitchen lidocaine (LIDODERM) 5 % Place 1 patch onto the skin daily. Apply patch to lower back (where shingles was ) on in AM for Shingles pain/post- herpetic neuralgia.  . Melatonin 10 MG TABS Take 10 mg by mouth at bedtime.  . memantine (NAMENDA) 5 MG tablet Take 5 mg by mouth 2 (two) times daily.  . pantoprazole (PROTONIX) 20 MG tablet Take 20 mg by mouth 2 (two) times daily. Every morning and every night  . rivastigmine (EXELON) 3 MG capsule Take 3 mg by mouth 2 (two) times daily.  . simethicone (MYLICON) 979 MG chewable tablet Chew 125 mg by mouth every 6 (six) hours as needed for flatulence.  . simvastatin (ZOCOR) 40 MG tablet Take 40 mg by mouth every evening.  . vitamin B-12 (CYANOCOBALAMIN) 1000 MCG tablet Take 1,000 mcg by mouth daily.   No facility-administered encounter medications on file as of 07/15/2020.    PHYSICAL EXAM:   BP 100/64  P 78  R  20   Sats  98% on rm air.  General: NAD, well nourished elderly female sitting in recliner Cardiovascular: regular rate and rhythm  Pulmonary: clear throughout Abdomen: soft, nontender, + bowel sounds Extremities: trace edema BLE, compression socks in use  Skin: exposed skin is intact Neurological: A&O x3.  Speaks in complete sentences. Forgetful related to detailed information Psych:  Pleasant and cheerful mood.  Cooperative  Gonzella Lex, NP-C

## 2020-09-17 ENCOUNTER — Other Ambulatory Visit: Payer: Self-pay | Admitting: Family

## 2020-09-17 DIAGNOSIS — K219 Gastro-esophageal reflux disease without esophagitis: Secondary | ICD-10-CM

## 2020-11-13 ENCOUNTER — Other Ambulatory Visit: Payer: Self-pay | Admitting: Family

## 2020-11-13 DIAGNOSIS — E785 Hyperlipidemia, unspecified: Secondary | ICD-10-CM

## 2024-06-30 DEATH — deceased
